# Patient Record
Sex: Female | Born: 1950 | Race: Black or African American | Hispanic: No | Smoking: Never smoker
Health system: Southern US, Community
[De-identification: ages and names within clinical notes are randomized; demographics above are authoritative.]

## PROBLEM LIST (undated history)

## (undated) DIAGNOSIS — J45909 Unspecified asthma, uncomplicated: Secondary | ICD-10-CM

## (undated) DIAGNOSIS — H269 Unspecified cataract: Secondary | ICD-10-CM

## (undated) DIAGNOSIS — T7840XA Allergy, unspecified, initial encounter: Secondary | ICD-10-CM

## (undated) DIAGNOSIS — I10 Essential (primary) hypertension: Secondary | ICD-10-CM

## (undated) DIAGNOSIS — K219 Gastro-esophageal reflux disease without esophagitis: Secondary | ICD-10-CM

## (undated) DIAGNOSIS — M1990 Unspecified osteoarthritis, unspecified site: Secondary | ICD-10-CM

## (undated) HISTORY — DX: Unspecified cataract: H26.9

## (undated) HISTORY — DX: Allergy, unspecified, initial encounter: T78.40XA

## (undated) HISTORY — PX: ABDOMINAL HYSTERECTOMY: SHX81

## (undated) HISTORY — DX: Gastro-esophageal reflux disease without esophagitis: K21.9

## (undated) HISTORY — DX: Unspecified osteoarthritis, unspecified site: M19.90

---
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
## 1998-05-20 ENCOUNTER — Ambulatory Visit (HOSPITAL_COMMUNITY): Admission: RE | Admit: 1998-05-20 | Discharge: 1998-05-20 | Payer: Self-pay | Admitting: Obstetrics and Gynecology

## 1998-05-20 ENCOUNTER — Encounter: Payer: Self-pay | Admitting: Obstetrics and Gynecology

## 2006-02-22 ENCOUNTER — Emergency Department (HOSPITAL_COMMUNITY): Admission: EM | Admit: 2006-02-22 | Discharge: 2006-02-22 | Payer: Self-pay | Admitting: Family Medicine

## 2008-11-22 ENCOUNTER — Emergency Department (HOSPITAL_COMMUNITY): Admission: EM | Admit: 2008-11-22 | Discharge: 2008-11-22 | Payer: Self-pay | Admitting: Family Medicine

## 2010-10-07 LAB — EYE CULTURE: Culture: NO GROWTH

## 2012-05-28 ENCOUNTER — Emergency Department (INDEPENDENT_AMBULATORY_CARE_PROVIDER_SITE_OTHER): Payer: Self-pay

## 2012-05-28 ENCOUNTER — Emergency Department (INDEPENDENT_AMBULATORY_CARE_PROVIDER_SITE_OTHER)
Admission: EM | Admit: 2012-05-28 | Discharge: 2012-05-28 | Disposition: A | Discharge status: Home / Self Care | Payer: Self-pay | Source: Home / Self Care | Attending: Family Medicine | Admitting: Family Medicine

## 2012-05-28 DIAGNOSIS — I10 Essential (primary) hypertension: Secondary | ICD-10-CM

## 2012-05-28 DIAGNOSIS — J9801 Acute bronchospasm: Secondary | ICD-10-CM

## 2012-05-28 DIAGNOSIS — R05 Cough: Secondary | ICD-10-CM

## 2012-05-28 DIAGNOSIS — R062 Wheezing: Secondary | ICD-10-CM

## 2012-05-28 HISTORY — DX: Essential (primary) hypertension: I10

## 2012-05-28 MED ORDER — PREDNISONE 20 MG PO TABS: 20.0000 mg | ORAL_TABLET | Freq: Two times a day (BID) | ORAL | Status: DC

## 2012-05-28 MED ORDER — ALBUTEROL SULFATE HFA 108 (90 BASE) MCG/ACT IN AERS: 2.0000 | INHALATION_SPRAY | Freq: Four times a day (QID) | RESPIRATORY_TRACT | Status: DC | PRN

## 2012-05-28 MED ORDER — TRIAMTERENE-HCTZ 37.5-25 MG PO TABS: 1.0000 | ORAL_TABLET | Freq: Every day | ORAL | Status: DC

## 2012-05-28 MED ORDER — BENZONATATE 200 MG PO CAPS: 200.0000 mg | ORAL_CAPSULE | Freq: Every day | ORAL | Status: DC

## 2012-05-28 NOTE — ED Provider Notes (Signed)
History     CSN: [id number]  Arrival date & time 05/28/12  1400   First MD Initiated Contact with Patient 05/28/12 1446      Chief Complaint  Patient presents with  . Wheezing      HPI Comments: Patient complains of chronic cough for several years, becoming worse over the past several weeks.  She took an unused Z-pack that her daughter had and noticed significant improvement, although she still has a mild cough and wheezing at night.  She also admits that she ran out of her hydrochlorothiazide about two months ago that she takes for hypertension.  She states that she has a history of seasonal rhinitis.  Her daughter has asthma.  She does not recall her last tetanus immunization.  No fevers, chills, and sweats   The history is provided by the patient and a relative.    Past Medical History  Diagnosis Date  . Hypertension     Past Surgical History  Procedure Date  . Abdominal hysterectomy     Family History  Problem Relation Age of Onset  . Hypertension Mother   . Heart failure Mother     History  Substance Use Topics  . Smoking status: Never Smoker   . Smokeless tobacco: Not on file  . Alcohol Use: Yes    OB History    Grav Para Term Preterm Abortions TAB SAB Ect Mult Living                  Review of Systems No sore throat + cough for several years No pleuritic pain + wheezing No nasal congestion + post-nasal drainage No sinus pain/pressure No itchy/red eyes No earache No hemoptysis ? SOB No fever/chills No nausea No vomiting No abdominal pain No diarrhea No urinary symptoms No skin rashes + fatigue No myalgias + headache Used OTC meds without relief  Allergies  Review of patient's allergies indicates not on file.  Home Medications   Current Outpatient Rx  Name  Route  Sig  Dispense  Refill  . ALBUTEROL SULFATE HFA 108 (90 BASE) MCG/ACT IN AERS   Inhalation   Inhale 2 puffs into the lungs every 6 (six) hours as needed for wheezing.   1  Inhaler   0   . BENZONATATE 200 MG PO CAPS   Oral   Take 1 capsule (200 mg total) by mouth at bedtime. Take as needed for cough   12 capsule   0   . PREDNISONE 20 MG PO TABS   Oral   Take 1 tablet (20 mg total) by mouth 2 (two) times daily.   10 tablet   0   . TRIAMTERENE-HCTZ 37.5-25 MG PO TABS   Oral   Take 1 each (1 tablet total) by mouth daily.   30 tablet   1     BP 158/78  Pulse 84  Temp 98.2 F (36.8 C) (Oral)  Resp 20  Ht 5\' 2"  (1.575 m)  Wt 166 lb (75.297 kg)  BMI 30.36 kg/m2  SpO2 95%  Physical Exam Nursing notes and Vital Signs reviewed. Appearance:  Patient appears healthy, stated age, and in no acute distress Eyes:  Pupils are equal, round, and reactive to light and accomodation.  Extraocular movement is intact.  Conjunctivae are not inflamed  Ears:  Canals normal.  Tympanic membranes normal.  Nose:  Normal turbinates.  No sinus tenderness.    Pharynx:  Normal Neck:  Supple. No adenopathy.  No JVD Lungs:  Clear to auscultation.  Breath sounds are equal.  Heart:  Regular rate and rhythm without murmurs, rubs, or gallops.  Abdomen:  Nontender without masses or hepatosplenomegaly.  Bowel sounds are present.  No CVA or flank tenderness.  Extremities:  No edema.  No calf tenderness Skin:  No rash present.   ED Course  Procedures none    Dg Chest 2 View  05/28/2012  *RADIOLOGY REPORT*  Clinical Data: Persistent cough  CHEST - 2 VIEW  Comparison: None.  Findings: Heart size is upper normal.  Negative for heart failure. Negative for infiltrate or effusion.  No mass lesion.  IMPRESSION: No active cardiopulmonary disease.   Original Report Authenticated By: Janeece Riggers, M.D.      1. Bronchospasm;  Suspect resolving pertussis   2. Essential hypertension, benign       MDM  Begin prednisone burst.  Begin albuterol MDI.  Prescription written for Benzonatate [facility]) to take at bedtime for night-time cough.  Resume Maxzide for Hypertension and followup  with PCP        Lattie Haw, MD 05/29/12 2146

## 2012-05-28 NOTE — ED Notes (Signed)
Pt states she has a chronic cough and the last couple of weeks wheezing/shortness of breath, unable to lay flat w/o wheezing.   W/o fever, self medicated with a Z- pak

## 2012-12-21 ENCOUNTER — Encounter (HOSPITAL_COMMUNITY): Payer: Self-pay | Admitting: Emergency Medicine

## 2012-12-21 ENCOUNTER — Emergency Department (INDEPENDENT_AMBULATORY_CARE_PROVIDER_SITE_OTHER)
Admission: EM | Admit: 2012-12-21 | Discharge: 2012-12-21 | Disposition: A | Discharge status: Home / Self Care | Payer: Self-pay | Source: Home / Self Care | Attending: Family Medicine | Admitting: Family Medicine

## 2012-12-21 DIAGNOSIS — J4521 Mild intermittent asthma with (acute) exacerbation: Secondary | ICD-10-CM

## 2012-12-21 DIAGNOSIS — J45901 Unspecified asthma with (acute) exacerbation: Secondary | ICD-10-CM

## 2012-12-21 MED ORDER — PREDNISONE 20 MG PO TABS: ORAL_TABLET | ORAL | Status: DC

## 2012-12-21 MED ORDER — CETIRIZINE HCL 10 MG PO TABS: 10.0000 mg | ORAL_TABLET | Freq: Every evening | ORAL | Status: DC | PRN

## 2012-12-21 MED ORDER — ALBUTEROL SULFATE HFA 108 (90 BASE) MCG/ACT IN AERS: 1.0000 | INHALATION_SPRAY | Freq: Four times a day (QID) | RESPIRATORY_TRACT | Status: DC | PRN

## 2012-12-21 MED ORDER — BENZONATATE 100 MG PO CAPS: 100.0000 mg | ORAL_CAPSULE | Freq: Three times a day (TID) | ORAL | Status: DC

## 2012-12-21 NOTE — ED Notes (Addendum)
Pt c/o persistent dry cough onset 2 weeks... sxs include wheezing... Denies cold sxs, fevers... Has taken mucinex for sxs w/no no relief.[facility]... She is alert and oriented w/no signs of acute distress.

## 2012-12-21 NOTE — ED Provider Notes (Signed)
History    CSN: [id number] Arrival date & time 12/21/12  1043  First MD Initiated Contact with Patient 12/21/12 1122     Chief Complaint  Patient presents with  . Cough   (Consider location/radiation/quality/duration/timing/severity/associated sxs/prior Treatment) HPI Comments: 62 year old female with history of hypertension and intermittent asthma symptoms. Here complaining of nonproductive cough and wheezing for the last week. Patient was treated with similar symptoms 6 months ago. Patient reports she has used albuterol about once a month since her last ED visit but has been using at least twice daily during the last few days. Current symptoms were preceded by nasal congestion and "sinus allergies" denies fever or chills. No chest pain. No headache or dizziness. Denies current or past history of acid reflux. Patient also reports that she has not taken her blood pressure medications in the last 2 days. He states she just forgot to take them but she has refills. Denies leg swelling or chest pain. No PND.  Past Medical History  Diagnosis Date  . Hypertension    Past Surgical History  Procedure Laterality Date  . Abdominal hysterectomy     Family History  Problem Relation Age of Onset  . Hypertension Mother   . Heart failure Mother    History  Substance Use Topics  . Smoking status: Never Smoker   . Smokeless tobacco: Not on file  . Alcohol Use: Yes   OB History   Grav Para Term Preterm Abortions TAB SAB Ect Mult Living                 Review of Systems  HENT: Positive for congestion, rhinorrhea and sneezing. Negative for sore throat.   Eyes: Negative for discharge.  Respiratory: Positive for cough and wheezing. Negative for shortness of breath.   Cardiovascular: Negative for chest pain, palpitations and leg swelling.  Gastrointestinal: Negative for nausea, vomiting, abdominal pain and diarrhea.  Skin: Negative for rash.  Neurological: Negative for dizziness and  headaches.  All other systems reviewed and are negative.    Allergies  Review of patient's allergies indicates no known allergies.  Home Medications   Current Outpatient Rx  Name  Route  Sig  Dispense  Refill  . albuterol (PROVENTIL HFA;VENTOLIN HFA) 108 (90 BASE) MCG/ACT inhaler   Inhalation   Inhale 1-2 puffs into the lungs every 6 (six) hours as needed for wheezing.   1 Inhaler   0   . benzonatate (TESSALON) 100 MG capsule   Oral   Take 1 capsule (100 mg total) by mouth every 8 (eight) hours.   21 capsule   0   . cetirizine (ZYRTEC) 10 MG tablet   Oral   Take 1 tablet (10 mg total) by mouth at bedtime as needed for allergies or rhinitis.   30 tablet   0   . predniSONE (DELTASONE) 20 MG tablet      2 tabs by mouth daily for 5 days.   10 tablet   0   . triamterene-hydrochlorothiazide (MAXZIDE-25) 37.5-25 MG per tablet   Oral   Take 1 each (1 tablet total) by mouth daily.   30 tablet   1    BP 151/89  Pulse 72  Temp(Src) 97.9 F (36.6 C) (Oral)  Resp 18  SpO2 98% Physical Exam  Vitals reviewed. Constitutional: She is oriented to person, place, and time. She appears well-developed and well-nourished.  HENT:  Head: Normocephalic and atraumatic.  Nasal Congestion with erythema and swelling of nasal turbinates, clear  rhinorrhea. no pharyngeal erythema no exudates. No uvula deviation. No trismus. TM's normal.  Eyes: Conjunctivae are normal. Right eye exhibits no discharge. Left eye exhibits no discharge.  Neck: Neck supple. No JVD present.  Cardiovascular: Normal rate, regular rhythm and normal heart sounds.   Pulmonary/Chest: Effort normal and breath sounds normal. No respiratory distress. She has no wheezes. She has no rales. She exhibits no tenderness.  Lymphadenopathy:    She has no cervical adenopathy.  Neurological: She is alert and oriented to person, place, and time.  Skin: No rash noted.    ED Course  Procedures (including critical care  time) Labs Reviewed - No data to display No results found. 1. Asthma exacerbation attacks, mild intermittent     MDM  Impress mild intermittent asthma exacerbation. Patient is not actively wheezing or in respiratory distress here. Prescribed albuterol, prednisone, cetirizine and Tessalon Perles. Recommended to restart blood pressure medications. Supportive care and red flags that should prompt return to medical attention discussed with patient and provided in writing. Specifically patient was asked to go to the emergency department if worsening respiratory symptoms despite following treatment and followup with her primary care provider in 1-3 weeks to recheck her blood pressure monitor her symptoms.  Sharin Grave, MD 12/22/12 [phone]

## 2013-06-07 ENCOUNTER — Encounter (HOSPITAL_BASED_OUTPATIENT_CLINIC_OR_DEPARTMENT_OTHER): Payer: Self-pay | Admitting: Emergency Medicine

## 2013-06-07 ENCOUNTER — Emergency Department (HOSPITAL_BASED_OUTPATIENT_CLINIC_OR_DEPARTMENT_OTHER): Payer: Medicaid Other

## 2013-06-07 ENCOUNTER — Emergency Department (HOSPITAL_BASED_OUTPATIENT_CLINIC_OR_DEPARTMENT_OTHER)
Admission: EM | Admit: 2013-06-07 | Discharge: 2013-06-07 | Disposition: A | Discharge status: Home / Self Care | Payer: Medicaid Other | Attending: Emergency Medicine | Admitting: Emergency Medicine

## 2013-06-07 DIAGNOSIS — J45901 Unspecified asthma with (acute) exacerbation: Secondary | ICD-10-CM | POA: Insufficient documentation

## 2013-06-07 DIAGNOSIS — R0982 Postnasal drip: Secondary | ICD-10-CM

## 2013-06-07 DIAGNOSIS — Z79899 Other long term (current) drug therapy: Secondary | ICD-10-CM | POA: Insufficient documentation

## 2013-06-07 DIAGNOSIS — I10 Essential (primary) hypertension: Secondary | ICD-10-CM | POA: Insufficient documentation

## 2013-06-07 MED ORDER — FLUTICASONE PROPIONATE 50 MCG/ACT NA SUSP: 2.0000 | Freq: Every day | NASAL | Status: DC

## 2013-06-07 MED ORDER — ALBUTEROL SULFATE (5 MG/ML) 0.5% IN NEBU
2.5000 mg | INHALATION_SOLUTION | Freq: Once | RESPIRATORY_TRACT | Status: AC
  Administered 2013-06-07: 2.5 mg via RESPIRATORY_TRACT
  Filled 2013-06-07: qty 0.5

## 2013-06-07 MED ORDER — PREDNISONE 50 MG PO TABS
60.0000 mg | ORAL_TABLET | Freq: Once | ORAL | Status: AC
  Administered 2013-06-07: 60 mg via ORAL
  Filled 2013-06-07 (×2): qty 1

## 2013-06-07 MED ORDER — FLUTICASONE PROPIONATE HFA 110 MCG/ACT IN AERO: 2.0000 | INHALATION_SPRAY | Freq: Two times a day (BID) | RESPIRATORY_TRACT | Status: DC

## 2013-06-07 MED ORDER — ALBUTEROL SULFATE HFA 108 (90 BASE) MCG/ACT IN AERS: 1.0000 | INHALATION_SPRAY | Freq: Four times a day (QID) | RESPIRATORY_TRACT | Status: DC | PRN

## 2013-06-07 MED ORDER — PREDNISONE 20 MG PO TABS: ORAL_TABLET | ORAL | Status: DC

## 2013-06-07 MED ORDER — GUAIFENESIN ER 600 MG PO TB12: 600.0000 mg | ORAL_TABLET | Freq: Two times a day (BID) | ORAL | Status: DC

## 2013-06-07 MED ORDER — IPRATROPIUM BROMIDE 0.02 % IN SOLN
0.5000 mg | Freq: Once | RESPIRATORY_TRACT | Status: AC
  Administered 2013-06-07: 0.5 mg via RESPIRATORY_TRACT
  Filled 2013-06-07: qty 2.5

## 2013-06-07 NOTE — ED Notes (Signed)
Cough and diff breathing for 4-5 days, denies fever. Pt states no relief after using mucinex or inhaler.

## 2013-06-07 NOTE — ED Notes (Signed)
MD at bedside. 

## 2013-06-07 NOTE — ED Notes (Signed)
Patient transported to X-ray 

## 2013-06-07 NOTE — ED Provider Notes (Signed)
CSN: [id number]     Arrival date & time 06/07/13  4098 History   First MD Initiated Contact with Patient 06/07/13 0356     Chief Complaint  Patient presents with  . Cough   (Consider location/radiation/quality/duration/timing/severity/associated sxs/prior Treatment) Patient is a 62 y.o. female presenting with cough. The history is provided by the patient.  Cough Severity:  Moderate Onset quality:  Gradual Timing:  Intermittent Progression:  Unchanged Chronicity:  Recurrent Smoker: no   Context: weather changes   Relieved by:  Nothing Worsened by:  Nothing tried Ineffective treatments:  Beta-agonist inhaler and decongestant Associated symptoms: wheezing   Associated symptoms: no chest pain, no diaphoresis, no fever and no sore throat   Wheezing:    Severity:  Moderate   Onset quality:  Gradual   Timing:  Intermittent   Progression:  Unchanged   Chronicity:  Recurrent Risk factors: no recent travel     Past Medical History  Diagnosis Date  . Hypertension    Past Surgical History  Procedure Laterality Date  . Abdominal hysterectomy     Family History  Problem Relation Age of Onset  . Hypertension Mother   . Heart failure Mother    History  Substance Use Topics  . Smoking status: Never Smoker   . Smokeless tobacco: Not on file  . Alcohol Use: Yes   OB History   Grav Para Term Preterm Abortions TAB SAB Ect Mult Living                 Review of Systems  Constitutional: Negative for fever and diaphoresis.  HENT: Negative for sore throat.   Respiratory: Positive for cough and wheezing.   Cardiovascular: Negative for chest pain, palpitations and leg swelling.  All other systems reviewed and are negative.    Allergies  Review of patient's allergies indicates no known allergies.  Home Medications   Current Outpatient Rx  Name  Route  Sig  Dispense  Refill  . albuterol (PROVENTIL HFA;VENTOLIN HFA) 108 (90 BASE) MCG/ACT inhaler   Inhalation   Inhale 1-2  puffs into the lungs every 6 (six) hours as needed for wheezing.   1 Inhaler   0   . triamterene-hydrochlorothiazide (MAXZIDE-25) 37.5-25 MG per tablet   Oral   Take 1 each (1 tablet total) by mouth daily.   30 tablet   1   . benzonatate (TESSALON) 100 MG capsule   Oral   Take 1 capsule (100 mg total) by mouth every 8 (eight) hours.   21 capsule   0   . cetirizine (ZYRTEC) 10 MG tablet   Oral   Take 1 tablet (10 mg total) by mouth at bedtime as needed for allergies or rhinitis.   30 tablet   0   . predniSONE (DELTASONE) 20 MG tablet      2 tabs by mouth daily for 5 days.   10 tablet   0    BP 181/96  Pulse 85  Temp(Src) 97.6 F (36.4 C) (Oral)  Resp 22  SpO2 98% Physical Exam  Constitutional: She is oriented to person, place, and time. She appears well-developed and well-nourished. No distress.  HENT:  Head: Normocephalic and atraumatic.  Mouth/Throat: Oropharynx is clear and moist.  Eyes: Conjunctivae are normal. Pupils are equal, round, and reactive to light.  Neck: Normal range of motion. Neck supple.  Pulmonary/Chest: No stridor. No respiratory distress. She has wheezes. She has no rales. She exhibits no tenderness.  Abdominal: Soft. Bowel sounds are  normal. There is no tenderness. There is no rebound.  Musculoskeletal: Normal range of motion.  Neurological: She is alert and oriented to person, place, and time.  Skin: Skin is warm and dry.  Psychiatric: She has a normal mood and affect.    ED Course  Procedures (including critical care time) Labs Review Labs Reviewed - No data to display Imaging Review No results found.  EKG Interpretation   None       MDM  No diagnosis found. Feels improved post neb treatment.  Lungs clear to auscultation post neb.  Will provide spacer for inhaler and give rx for prednisone.  Nasal steroids for post nasal drip    Burley Kopka K Skylar Priest-Rasch, MD 06/07/13 [phone]

## 2014-01-12 DIAGNOSIS — E7800 Pure hypercholesterolemia, unspecified: Secondary | ICD-10-CM | POA: Diagnosis not present

## 2014-01-12 DIAGNOSIS — M25569 Pain in unspecified knee: Secondary | ICD-10-CM | POA: Diagnosis not present

## 2014-01-12 DIAGNOSIS — I10 Essential (primary) hypertension: Secondary | ICD-10-CM | POA: Diagnosis not present

## 2014-01-12 DIAGNOSIS — Z0000 Encounter for general adult medical examination without abnormal findings: Secondary | ICD-10-CM | POA: Diagnosis not present

## 2014-01-12 DIAGNOSIS — R7309 Other abnormal glucose: Secondary | ICD-10-CM | POA: Diagnosis not present

## 2014-01-12 DIAGNOSIS — Z79899 Other long term (current) drug therapy: Secondary | ICD-10-CM | POA: Diagnosis not present

## 2014-01-12 DIAGNOSIS — E559 Vitamin D deficiency, unspecified: Secondary | ICD-10-CM | POA: Diagnosis not present

## 2014-01-16 DIAGNOSIS — I10 Essential (primary) hypertension: Secondary | ICD-10-CM | POA: Diagnosis not present

## 2014-01-16 DIAGNOSIS — E7800 Pure hypercholesterolemia, unspecified: Secondary | ICD-10-CM | POA: Diagnosis not present

## 2014-01-16 DIAGNOSIS — Z0000 Encounter for general adult medical examination without abnormal findings: Secondary | ICD-10-CM | POA: Diagnosis not present

## 2014-01-16 DIAGNOSIS — Z79899 Other long term (current) drug therapy: Secondary | ICD-10-CM | POA: Diagnosis not present

## 2014-01-16 DIAGNOSIS — R7309 Other abnormal glucose: Secondary | ICD-10-CM | POA: Diagnosis not present

## 2014-01-22 ENCOUNTER — Other Ambulatory Visit: Payer: Self-pay

## 2014-01-22 DIAGNOSIS — Z1231 Encounter for screening mammogram for malignant neoplasm of breast: Secondary | ICD-10-CM

## 2014-02-07 ENCOUNTER — Ambulatory Visit
Admission: RE | Admit: 2014-02-07 | Discharge: 2014-02-07 | Disposition: A | Discharge status: Home / Self Care | Payer: Medicare Other | Source: Ambulatory Visit

## 2014-02-07 DIAGNOSIS — Z1231 Encounter for screening mammogram for malignant neoplasm of breast: Secondary | ICD-10-CM

## 2014-03-22 ENCOUNTER — Emergency Department (HOSPITAL_BASED_OUTPATIENT_CLINIC_OR_DEPARTMENT_OTHER)
Admission: EM | Admit: 2014-03-22 | Discharge: 2014-03-22 | Disposition: A | Discharge status: Home / Self Care | Payer: Medicare Other | Attending: Emergency Medicine | Admitting: Emergency Medicine

## 2014-03-22 ENCOUNTER — Encounter (HOSPITAL_BASED_OUTPATIENT_CLINIC_OR_DEPARTMENT_OTHER): Payer: Self-pay | Admitting: Emergency Medicine

## 2014-03-22 ENCOUNTER — Emergency Department (HOSPITAL_BASED_OUTPATIENT_CLINIC_OR_DEPARTMENT_OTHER): Payer: Medicare Other

## 2014-03-22 DIAGNOSIS — Z79899 Other long term (current) drug therapy: Secondary | ICD-10-CM | POA: Diagnosis not present

## 2014-03-22 DIAGNOSIS — IMO0002 Reserved for concepts with insufficient information to code with codable children: Secondary | ICD-10-CM | POA: Diagnosis not present

## 2014-03-22 DIAGNOSIS — J45909 Unspecified asthma, uncomplicated: Secondary | ICD-10-CM | POA: Insufficient documentation

## 2014-03-22 DIAGNOSIS — R0789 Other chest pain: Secondary | ICD-10-CM | POA: Diagnosis not present

## 2014-03-22 DIAGNOSIS — I10 Essential (primary) hypertension: Secondary | ICD-10-CM | POA: Diagnosis not present

## 2014-03-22 DIAGNOSIS — R079 Chest pain, unspecified: Secondary | ICD-10-CM | POA: Diagnosis not present

## 2014-03-22 HISTORY — DX: Unspecified asthma, uncomplicated: J45.909

## 2014-03-22 LAB — CBC WITH DIFFERENTIAL/PLATELET
Basophils Absolute: 0 10*3/uL (ref 0.0–0.1)
Basophils Relative: 0 % (ref 0–1)
Eosinophils Absolute: 0.2 10*3/uL (ref 0.0–0.7)
Eosinophils Relative: 2 % (ref 0–5)
HEMATOCRIT: 42.7 % (ref 36.0–46.0)
HEMOGLOBIN: 14.1 g/dL (ref 12.0–15.0)
LYMPHS ABS: 2.3 10*3/uL (ref 0.7–4.0)
Lymphocytes Relative: 26 % (ref 12–46)
MCH: 30.7 pg (ref 26.0–34.0)
MCHC: 33 g/dL (ref 30.0–36.0)
MCV: 92.8 fL (ref 78.0–100.0)
MONO ABS: 0.8 10*3/uL (ref 0.1–1.0)
MONOS PCT: 9 % (ref 3–12)
NEUTROS ABS: 5.5 10*3/uL (ref 1.7–7.7)
NEUTROS PCT: 63 % (ref 43–77)
Platelets: 284 10*3/uL (ref 150–400)
RBC: 4.6 MIL/uL (ref 3.87–5.11)
RDW: 12.7 % (ref 11.5–15.5)
WBC: 8.7 10*3/uL (ref 4.0–10.5)

## 2014-03-22 LAB — BASIC METABOLIC PANEL
Anion gap: 13 (ref 5–15)
BUN: 21 mg/dL (ref 6–23)
CHLORIDE: 105 meq/L (ref 96–112)
CO2: 25 meq/L (ref 19–32)
Calcium: 10.4 mg/dL (ref 8.4–10.5)
Creatinine, Ser: 1 mg/dL (ref 0.50–1.10)
GFR calc Af Amer: 67 mL/min — ABNORMAL LOW (ref >90)
GFR calc non Af Amer: 58 mL/min — ABNORMAL LOW (ref >90)
GLUCOSE: 126 mg/dL — AB (ref 70–99)
POTASSIUM: 3.9 meq/L (ref 3.7–5.3)
Sodium: 143 mEq/L (ref 137–147)

## 2014-03-22 LAB — TROPONIN I: Troponin I: <0.3 ng/mL (ref <0.30)

## 2014-03-22 MED ORDER — GI COCKTAIL ~~LOC~~
30.0000 mL | Freq: Once | ORAL | Status: AC
  Administered 2014-03-22: 30 mL via ORAL
  Filled 2014-03-22: qty 30

## 2014-03-22 MED ORDER — PANTOPRAZOLE SODIUM 40 MG PO TBEC
40.0000 mg | DELAYED_RELEASE_TABLET | Freq: Once | ORAL | Status: AC
  Administered 2014-03-22: 40 mg via ORAL
  Filled 2014-03-22: qty 1

## 2014-03-22 NOTE — ED Notes (Signed)
Pt c/o chest tightness x 3 days,  Denies sob  States has been wheezing

## 2014-03-22 NOTE — ED Notes (Signed)
Pt c/o " chest tightness"n x 3 hrs denies SOB or nausea

## 2014-03-22 NOTE — ED Notes (Signed)
Ask by RN to assess pt for wheezes. No wheezes heard equal breath sounds pt in no distress.

## 2014-03-22 NOTE — Discharge Instructions (Signed)

## 2014-03-22 NOTE — ED Provider Notes (Signed)
CSN: [id number]     Arrival date & time 03/22/14  0026; seen by myself at 0039 History   None    Chief Complaint  Patient presents with  . Chest Pain     (Consider location/radiation/quality/duration/timing/severity/associated sxs/prior Treatment) HPI This is a 63 year old female with a history of hypertension and asthma. She developed substernal tightness after eating dinner at 9:30 PM yesterday evening. She particularly attributes this to eating a cinnamon bun rapidly. The symptoms were moderate but never severe. She is equivocal about whether there was associated shortness of breath but there was associated coughing. She describes a tightness is also being accompanied by heartburn. Symptoms had significantly improved by the time she arrived here. Her residual heartburn was completely relieved by a GI cocktail. She denies nausea, vomiting, diarrhea or diaphoresis. She states the chest tightness was somewhat like her asthma but she was not wheezing on this occasion. There was no exacerbation with exertion and no relief with rest.   Past Medical History  Diagnosis Date  . Hypertension   . Asthma    Past Surgical History  Procedure Laterality Date  . Abdominal hysterectomy     Family History  Problem Relation Age of Onset  . Hypertension Mother   . Heart failure Mother    History  Substance Use Topics  . Smoking status: Never Smoker   . Smokeless tobacco: Not on file  . Alcohol Use: Yes   OB History   Grav Para Term Preterm Abortions TAB SAB Ect Mult Living                 Review of Systems  All other systems reviewed and are negative.   Allergies  Review of patient's allergies indicates no known allergies.  Home Medications   Prior to Admission medications   Medication Sig Start Date End Date Taking? Authorizing Provider  albuterol (PROVENTIL HFA;VENTOLIN HFA) 108 (90 BASE) MCG/ACT inhaler Inhale 1-2 puffs into the lungs every 6 (six) hours as needed for wheezing.  12/21/12   Adlih Moreno-Coll, MD  albuterol (PROVENTIL HFA;VENTOLIN HFA) 108 (90 BASE) MCG/ACT inhaler Inhale 1-2 puffs into the lungs every 6 (six) hours as needed for wheezing or shortness of breath. 06/07/13   April K Palumbo-Rasch, MD  benzonatate (TESSALON) 100 MG capsule Take 1 capsule (100 mg total) by mouth every 8 (eight) hours. 12/21/12   Adlih Moreno-Coll, MD  cetirizine (ZYRTEC) 10 MG tablet Take 1 tablet (10 mg total) by mouth at bedtime as needed for allergies or rhinitis. 12/21/12   Adlih Moreno-Coll, MD  fluticasone (FLONASE) 50 MCG/ACT nasal spray Place 2 sprays into both nostrils daily. 06/07/13   April K Palumbo-Rasch, MD  fluticasone (FLOVENT HFA) 110 MCG/ACT inhaler Inhale 2 puffs into the lungs 2 (two) times daily. 06/07/13   April K Palumbo-Rasch, MD  guaiFENesin (MUCINEX) 600 MG 12 hr tablet Take 1 tablet (600 mg total) by mouth 2 (two) times daily. 06/07/13   April K Palumbo-Rasch, MD  triamterene-hydrochlorothiazide (MAXZIDE-25) 37.5-25 MG per tablet Take 1 each (1 tablet total) by mouth daily. 05/28/12   Lattie Haw, MD   BP 159/81  Pulse 96  Temp(Src) 97.8 F (36.6 C)  Resp 18  Ht  (1.575 m)  Wt 170 lb (77.111 kg)  BMI 31.09 kg/m2  SpO2 99%  Physical Exam General: Well-developed, well-nourished female in no acute distress; appearance consistent with age of record HENT: normocephalic; atraumatic Eyes: pupils equal, round and reactive to light; extraocular muscles intact Neck:  supple Heart: regular rate and rhythm; no murmurs, rubs or gallops Lungs: clear to auscultation bilaterally Abdomen: soft; nondistended; nontender; no masses or hepatosplenomegaly; bowel sounds present Extremities: No deformity; full range of motion; pulses normal Neurologic: Awake, alert and oriented; motor function intact in all extremities and symmetric; no facial droop Skin: Warm and dry Psychiatric: Normal mood and affect    ED Course  Procedures (including critical care  time)  MDM    EKG Interpretation  Date/Time:  Thursday March 22 2014 00:39:50 EDT Ventricular Rate:  87 PR Interval:  184 QRS Duration: 106 QT Interval:  380 QTC Calculation: 457 R Axis:   -44 Text Interpretation:  Normal sinus rhythm Left axis deviation Pulmonary disease pattern Incomplete right bundle branch block Nonspecific T wave abnormality Abnormal ECG No old tracing to compare Confirmed by Linette Gunderson  MD, Jonny Ruiz ([id number]) on 03/22/2014 3:35:17 AM      Nursing notes and vitals signs, including pulse oximetry, reviewed.  Summary of this visit's results, reviewed by myself:  Labs:  Results for orders placed during the hospital encounter of 03/22/14 (from the past 24 hour(s))  CBC WITH DIFFERENTIAL     Status: None   Collection Time    03/22/14  2:45 AM      Result Value Ref Range   WBC 8.7  4.0 - 10.5 K/uL   RBC 4.60  3.87 - 5.11 MIL/uL   Hemoglobin 14.1  12.0 - 15.0 g/dL   HCT 21.3  08.6 - 57.8 %   MCV 92.8  78.0 - 100.0 fL   MCH 30.7  26.0 - 34.0 pg   MCHC 33.0  30.0 - 36.0 g/dL   RDW 46.9  62.9 - 52.8 %   Platelets 284  150 - 400 K/uL   Neutrophils Relative % 63  43 - 77 %   Neutro Abs 5.5  1.7 - 7.7 K/uL   Lymphocytes Relative 26  12 - 46 %   Lymphs Abs 2.3  0.7 - 4.0 K/uL   Monocytes Relative 9  3 - 12 %   Monocytes Absolute 0.8  0.1 - 1.0 K/uL   Eosinophils Relative 2  0 - 5 %   Eosinophils Absolute 0.2  0.0 - 0.7 K/uL   Basophils Relative 0  0 - 1 %   Basophils Absolute 0.0  0.0 - 0.1 K/uL  BASIC METABOLIC PANEL     Status: Abnormal   Collection Time    03/22/14  2:45 AM      Result Value Ref Range   Sodium 143  137 - 147 mEq/L   Potassium 3.9  3.7 - 5.3 mEq/L   Chloride 105  96 - 112 mEq/L   CO2 25  19 - 32 mEq/L   Glucose, Bld 126 (*) 70 - 99 mg/dL   BUN 21  6 - 23 mg/dL   Creatinine, Ser 4.13  0.50 - 1.10 mg/dL   Calcium 24.4  8.4 - 01.0 mg/dL   GFR calc non Af Amer 58 (*) >90 mL/min   GFR calc Af Amer 67 (*) >90 mL/min   Anion gap 13  5 - 15   TROPONIN I     Status: None   Collection Time    03/22/14  2:45 AM      Result Value Ref Range   Troponin I <0.30  <0.30 ng/mL  TROPONIN I     Status: None   Collection Time    03/22/14  3:51 AM  Result Value Ref Range   Troponin I <0.30  <0.30 ng/mL    Imaging Studies: Dg Chest 2 View  03/22/2014   CLINICAL DATA:  Chest tightness, sudden onset last night  EXAM: CHEST  2 VIEW  COMPARISON:  05/29/2013  FINDINGS: Stable mild cardiac enlargement. Vascular pattern is normal. No consolidation effusion or pneumothorax.  IMPRESSION: No active cardiopulmonary disease.   Electronically Signed   By: Esperanza Heir M.D.   On: 03/22/2014 02:56   4:23 AM Patient continues to be asymptomatic in the ED after GI cocktail. Patient advised to contact PCP today for follow up or return if worsening.      Hanley Seamen, MD 03/22/14 [phone]

## 2014-04-16 DIAGNOSIS — Z13820 Encounter for screening for osteoporosis: Secondary | ICD-10-CM | POA: Diagnosis not present

## 2014-04-16 DIAGNOSIS — M810 Age-related osteoporosis without current pathological fracture: Secondary | ICD-10-CM | POA: Diagnosis not present

## 2014-06-06 DIAGNOSIS — R062 Wheezing: Secondary | ICD-10-CM | POA: Diagnosis not present

## 2014-06-06 DIAGNOSIS — Z79899 Other long term (current) drug therapy: Secondary | ICD-10-CM | POA: Diagnosis not present

## 2014-06-06 DIAGNOSIS — I10 Essential (primary) hypertension: Secondary | ICD-10-CM | POA: Diagnosis not present

## 2014-06-06 DIAGNOSIS — F418 Other specified anxiety disorders: Secondary | ICD-10-CM | POA: Diagnosis not present

## 2014-08-10 DIAGNOSIS — H40003 Preglaucoma, unspecified, bilateral: Secondary | ICD-10-CM | POA: Diagnosis not present

## 2014-08-10 DIAGNOSIS — H3589 Other specified retinal disorders: Secondary | ICD-10-CM | POA: Diagnosis not present

## 2014-08-10 DIAGNOSIS — H52223 Regular astigmatism, bilateral: Secondary | ICD-10-CM | POA: Diagnosis not present

## 2014-08-10 DIAGNOSIS — H40013 Open angle with borderline findings, low risk, bilateral: Secondary | ICD-10-CM | POA: Diagnosis not present

## 2014-08-10 DIAGNOSIS — H04123 Dry eye syndrome of bilateral lacrimal glands: Secondary | ICD-10-CM | POA: Diagnosis not present

## 2014-08-10 DIAGNOSIS — H524 Presbyopia: Secondary | ICD-10-CM | POA: Diagnosis not present

## 2014-08-23 DIAGNOSIS — H11153 Pinguecula, bilateral: Secondary | ICD-10-CM | POA: Diagnosis not present

## 2014-08-23 DIAGNOSIS — H40013 Open angle with borderline findings, low risk, bilateral: Secondary | ICD-10-CM | POA: Diagnosis not present

## 2014-08-23 DIAGNOSIS — H3589 Other specified retinal disorders: Secondary | ICD-10-CM | POA: Diagnosis not present

## 2014-08-23 DIAGNOSIS — H04123 Dry eye syndrome of bilateral lacrimal glands: Secondary | ICD-10-CM | POA: Diagnosis not present

## 2014-08-23 DIAGNOSIS — H2513 Age-related nuclear cataract, bilateral: Secondary | ICD-10-CM | POA: Diagnosis not present

## 2014-08-23 DIAGNOSIS — H18413 Arcus senilis, bilateral: Secondary | ICD-10-CM | POA: Diagnosis not present

## 2014-10-02 ENCOUNTER — Ambulatory Visit (INDEPENDENT_AMBULATORY_CARE_PROVIDER_SITE_OTHER): Payer: Medicare Other | Admitting: Internal Medicine

## 2014-10-02 ENCOUNTER — Encounter: Payer: Self-pay | Admitting: Internal Medicine

## 2014-10-02 ENCOUNTER — Other Ambulatory Visit (INDEPENDENT_AMBULATORY_CARE_PROVIDER_SITE_OTHER): Payer: Medicare Other

## 2014-10-02 VITALS — BP 152/92 | HR 84 | Temp 98.1°F | Resp 14 | Ht 62.0 in | Wt 169.1 lb

## 2014-10-02 DIAGNOSIS — R7301 Impaired fasting glucose: Secondary | ICD-10-CM

## 2014-10-02 DIAGNOSIS — Z0000 Encounter for general adult medical examination without abnormal findings: Secondary | ICD-10-CM | POA: Diagnosis not present

## 2014-10-02 DIAGNOSIS — I10 Essential (primary) hypertension: Secondary | ICD-10-CM

## 2014-10-02 DIAGNOSIS — R0602 Shortness of breath: Secondary | ICD-10-CM | POA: Diagnosis not present

## 2014-10-02 DIAGNOSIS — E789 Disorder of lipoprotein metabolism, unspecified: Secondary | ICD-10-CM

## 2014-10-02 DIAGNOSIS — Z0001 Encounter for general adult medical examination with abnormal findings: Secondary | ICD-10-CM

## 2014-10-02 DIAGNOSIS — F411 Generalized anxiety disorder: Secondary | ICD-10-CM

## 2014-10-02 DIAGNOSIS — E669 Obesity, unspecified: Secondary | ICD-10-CM

## 2014-10-02 LAB — COMPREHENSIVE METABOLIC PANEL
ALK PHOS: 94 U/L (ref 39–117)
ALT: 14 U/L (ref 0–35)
AST: 15 U/L (ref 0–37)
Albumin: 4.3 g/dL (ref 3.5–5.2)
BILIRUBIN TOTAL: 0.4 mg/dL (ref 0.2–1.2)
BUN: 12 mg/dL (ref 6–23)
CO2: 29 mEq/L (ref 19–32)
Calcium: 11.1 mg/dL — ABNORMAL HIGH (ref 8.4–10.5)
Chloride: 104 mEq/L (ref 96–112)
Creatinine, Ser: 0.81 mg/dL (ref 0.40–1.20)
GFR: 91.03 mL/min (ref >60.00)
Glucose, Bld: 94 mg/dL (ref 70–99)
Potassium: 3.9 mEq/L (ref 3.5–5.1)
Sodium: 138 mEq/L (ref 135–145)
TOTAL PROTEIN: 7.4 g/dL (ref 6.0–8.3)

## 2014-10-02 LAB — CBC
HEMATOCRIT: 45.5 % (ref 36.0–46.0)
HEMOGLOBIN: 15.5 g/dL — AB (ref 12.0–15.0)
MCHC: 34.1 g/dL (ref 30.0–36.0)
MCV: 88.9 fl (ref 78.0–100.0)
PLATELETS: 304 10*3/uL (ref 150.0–400.0)
RBC: 5.12 Mil/uL — AB (ref 3.87–5.11)
RDW: 13.4 % (ref 11.5–15.5)
WBC: 7.4 10*3/uL (ref 4.0–10.5)

## 2014-10-02 LAB — LIPID PANEL
CHOLESTEROL: 205 mg/dL — AB (ref 0–200)
HDL: 64.4 mg/dL (ref >39.00)
LDL Cholesterol: 117 mg/dL — ABNORMAL HIGH (ref 0–99)
NonHDL: 140.6
TRIGLYCERIDES: 118 mg/dL (ref 0.0–149.0)
Total CHOL/HDL Ratio: 3
VLDL: 23.6 mg/dL (ref 0.0–40.0)

## 2014-10-02 LAB — HEMOGLOBIN A1C: HEMOGLOBIN A1C: 5.9 % (ref 4.6–6.5)

## 2014-10-02 NOTE — Progress Notes (Signed)
Pre visit review using our clinic review tool, if applicable. No additional management support is needed unless otherwise documented below in the visit note. 

## 2014-10-02 NOTE — Patient Instructions (Signed)
We will check the blood work today and call you back about the results.   We would recommend talking to a counselor about your anxiety to see if they can come up with solutions for you that are not medicine. You can keep the rest of the clonazepam but I do not recommend taking it except when absolutely necessary then using 1/2 tablet.   We will send you for a breathing test to see if you really have asthma or lung problems. If you do not then you will not need breathing medicines on a regular basis.   We will see you back in about 6 months to see how you are doing. Exercise is a good way to help manage stress and you should be exercising 3 times a week for 30 minutes a day.   Stress and Stress Management Stress is a normal reaction to life events. It is what you feel when life demands more than you are used to or more than you can handle. Some stress can be useful. For example, the stress reaction can help you catch the last bus of the day, study for a test, or meet a deadline at work. But stress that occurs too often or for too long can cause problems. It can affect your emotional health and interfere with relationships and normal daily activities. Too much stress can weaken your immune system and increase your risk for physical illness. If you already have a medical problem, stress can make it worse. CAUSES  All sorts of life events may cause stress. An event that causes stress for one person may not be stressful for another person. Major life events commonly cause stress. These may be positive or negative. Examples include losing your job, moving into a new home, getting married, having a baby, or losing a loved one. Less obvious life events may also cause stress, especially if they occur day after day or in combination. Examples include working long hours, driving in traffic, caring for children, being in debt, or being in a difficult relationship. SIGNS AND SYMPTOMS Stress may cause emotional  symptoms including, the following:  Anxiety. This is feeling worried, afraid, on edge, overwhelmed, or out of control.  Anger. This is feeling irritated or impatient.  Depression. This is feeling sad, down, helpless, or guilty.  Difficulty focusing, remembering, or making decisions. Stress may cause physical symptoms, including the following:   Aches and pains. These may affect your head, neck, back, stomach, or other areas of your body.  Tight muscles or clenched jaw.  Low energy or trouble sleeping. Stress may cause unhealthy behaviors, including the following:   Eating to feel better (overeating) or skipping meals.  Sleeping too little, too much, or both.  Working too much or putting off tasks (procrastination).  Smoking, drinking alcohol, or using drugs to feel better. DIAGNOSIS  Stress is diagnosed through an assessment by your health care provider. Your health care provider will ask questions about your symptoms and any stressful life events.Your health care provider will also ask about your medical history and may order blood tests or other tests. Certain medical conditions and medicine can cause physical symptoms similar to stress. Mental illness can cause emotional symptoms and unhealthy behaviors similar to stress. Your health care provider may refer you to a mental health professional for further evaluation.  TREATMENT  Stress management is the recommended treatment for stress.The goals of stress management are reducing stressful life events and coping with stress in healthy ways.  Techniques for reducing stressful life events include the following:  Stress identification. Self-monitor for stress and identify what causes stress for you. These skills may help you to avoid some stressful events.  Time management. Set your priorities, keep a calendar of events, and learn to say "no." These tools can help you avoid making too many commitments. Techniques for coping with  stress include the following:  Rethinking the problem. Try to think realistically about stressful events rather than ignoring them or overreacting. Try to find the positives in a stressful situation rather than focusing on the negatives.  Exercise. Physical exercise can release both physical and emotional tension. The key is to find a form of exercise you enjoy and do it regularly.  Relaxation techniques. These relax the body and mind. Examples include yoga, meditation, tai chi, biofeedback, deep breathing, progressive muscle relaxation, listening to music, being out in nature, journaling, and other hobbies. Again, the key is to find one or more that you enjoy and can do regularly.  Healthy lifestyle. Eat a balanced diet, get plenty of sleep, and do not smoke. Avoid using alcohol or drugs to relax.  Strong support network. Spend time with family, friends, or other people you enjoy being around.Express your feelings and talk things over with someone you trust. Counseling or talktherapy with a mental health professional may be helpful if you are having difficulty managing stress on your own. Medicine is typically not recommended for the treatment of stress.Talk to your health care provider if you think you need medicine for symptoms of stress. HOME CARE INSTRUCTIONS  Keep all follow-up visits as directed by your health care provider.  Take all medicines as directed by your health care provider. SEEK MEDICAL CARE IF:  Your symptoms get worse or you start having new symptoms.  You feel overwhelmed by your problems and can no longer manage them on your own. SEEK IMMEDIATE MEDICAL CARE IF:  You feel like hurting yourself or someone else. Document Released: 12/09/2000 Document Revised: 10/30/2013 Document Reviewed: 02/07/2013 [facility] Patient Information 2015 [place], [place]. This information is not intended to replace advice given to you by your health care provider. Make sure you discuss any  questions you have with your health care provider.

## 2014-10-04 ENCOUNTER — Encounter: Payer: Self-pay | Admitting: Internal Medicine

## 2014-10-04 DIAGNOSIS — F411 Generalized anxiety disorder: Secondary | ICD-10-CM | POA: Insufficient documentation

## 2014-10-04 DIAGNOSIS — I10 Essential (primary) hypertension: Secondary | ICD-10-CM | POA: Insufficient documentation

## 2014-10-04 DIAGNOSIS — R058 Other specified cough: Secondary | ICD-10-CM | POA: Insufficient documentation

## 2014-10-04 DIAGNOSIS — R05 Cough: Secondary | ICD-10-CM | POA: Insufficient documentation

## 2014-10-04 NOTE — Progress Notes (Signed)
**Note Theresa-Identified via Obfuscation**    Subjective:    Patient ID: Theresa Terry, female    DOB: 08-12-1950, 64 y.o.   MRN: [id number]  HPI The patient is a 64 YO female who is coming in about her breathing. She has been having episodes of SOB where she ends up in the ER and has been diagnosed with asthma in the last year. She feels that mostly it is from her anxiety. She does not notice that the inhalers she has been given have helped her breathing. She was also given some prednisone which she does not feel helped. She has never had asthma before. She is a never smoker but did work in [employer] for many years. Denies SOB with exertion or exercise.   [facility], social history reviewed and updated.   Review of Systems  Constitutional: Negative for fever, activity change, appetite change, fatigue and unexpected weight change.  HENT: Negative.   Eyes: Negative.   Respiratory: Positive for shortness of breath and wheezing. Negative for cough and chest tightness.   Cardiovascular: Negative for chest pain, palpitations and leg swelling.  Gastrointestinal: Negative for nausea, abdominal pain, diarrhea, constipation and abdominal distention.  Musculoskeletal: Negative.   Skin: Negative.   Neurological: Negative.  Negative for weakness and headaches.  Psychiatric/Behavioral: Positive for sleep disturbance and decreased concentration. Negative for suicidal ideas and self-injury. The patient is nervous/anxious.       Objective:   Physical Exam  Constitutional: She is oriented to person, place, and time. She appears well-developed and well-nourished.  Overweight  HENT:  Head: Normocephalic and atraumatic.  Eyes: EOM are normal.  Neck: Normal range of motion.  Cardiovascular: Normal rate and regular rhythm.   Pulmonary/Chest: Effort normal and breath sounds normal. No respiratory distress. She has no wheezes. She has no rales. She exhibits no tenderness.  Abdominal: Soft. Bowel sounds are normal.  Musculoskeletal: She exhibits no  edema.  Neurological: She is alert and oriented to person, place, and time.  Skin: Skin is warm and dry.  Psychiatric:  Slightly anxious during our visit   Filed Vitals:   10/02/14 1007  BP: 152/92  Pulse: 84  Temp: 98.1 F (36.7 C)  TempSrc: Oral  Resp: 14  Height: 5\' 2"  (1.575 m)  Weight: 169 lb 1.9 oz (76.712 kg)  SpO2: 94%      Assessment & Plan:

## 2014-10-04 NOTE — Assessment & Plan Note (Signed)
Checking HgA1c, lipid panel, CMP today to ensure no missed complications from her obesity.

## 2014-10-04 NOTE — Assessment & Plan Note (Signed)
Currently taking maxzide and BP elevated today. Per her reports she is well controlled at home. Will recheck next visit and adjust therapy as needed. Check labs today.

## 2014-10-04 NOTE — Assessment & Plan Note (Addendum)
She had been given clonazepam to take TID prn but she wisely did not do such. She took 1 and got very dazed. Advised if emergency can take 1/2 pill. Would recommend counseling with CBT since she would like to avoid medication. If needs medication would pursue SSRI rather than benzo. Referral for counseling today.

## 2014-10-04 NOTE — Assessment & Plan Note (Signed)
Previously diagnosed with asthma but unclear to me if this is the diagnosis. She will continue with the flovent daily and albuterol prn but will order PFT with metcholine challenge (if needed) to see if asthma is true diagnosis. Another concern would be vocal cord dysfunction as she describes some wheezing that happens in these acute episodes. Not in acute flare. Also feel that her anxiety could be playing a role and will work to better control that.

## 2014-10-26 ENCOUNTER — Ambulatory Visit (INDEPENDENT_AMBULATORY_CARE_PROVIDER_SITE_OTHER): Payer: Medicare Other | Admitting: Family Medicine

## 2014-10-26 ENCOUNTER — Ambulatory Visit (INDEPENDENT_AMBULATORY_CARE_PROVIDER_SITE_OTHER): Payer: Medicare Other

## 2014-10-26 ENCOUNTER — Telehealth: Payer: Self-pay | Admitting: Internal Medicine

## 2014-10-26 VITALS — BP 120/74 | HR 83 | Temp 98.0°F | Resp 16 | Ht 62.0 in | Wt 164.0 lb

## 2014-10-26 DIAGNOSIS — M1712 Unilateral primary osteoarthritis, left knee: Secondary | ICD-10-CM

## 2014-10-26 DIAGNOSIS — M2392 Unspecified internal derangement of left knee: Secondary | ICD-10-CM

## 2014-10-26 DIAGNOSIS — M25562 Pain in left knee: Secondary | ICD-10-CM

## 2014-10-26 DIAGNOSIS — M238X2 Other internal derangements of left knee: Secondary | ICD-10-CM

## 2014-10-26 MED ORDER — MELOXICAM 7.5 MG PO TABS: 7.5000 mg | ORAL_TABLET | Freq: Every day | ORAL | Status: DC

## 2014-10-26 NOTE — Telephone Encounter (Signed)
Patient Name: Theresa Terry DOB: 1950/11/03 Initial Comment Caller states she has knee pain and swelling for the last week Nurse Assessment Nurse: Charna Elizabethrumbull, RN, Cathy Date/Time (Eastern Time): 10/26/2014 9:12:33 AM Confirm and document reason for call. If symptomatic, describe symptoms. ---Caller states she developed pain and swelling of her left knee about a week ago. No known injury. No fever. Has the patient traveled out of the country within the last 30 days? ---No Does the patient require triage? ---Yes Related visit to physician within the last 2 weeks? ---No Does the PT have any chronic conditions? (i.e. diabetes, asthma, etc.) ---Yes List chronic conditions. ---High Blood Pressure, Asthma? Guidelines Guideline Title Affirmed Question Affirmed Notes Knee Pain [1] Thigh or calf pain AND [2] only 1 side AND [3] present > 1 hour Final Disposition User See Physician within 4 Hours (or PCP triage) Charna Elizabethrumbull, RN, Cathy Comments Caller plans to go to urgent care facility.

## 2014-10-26 NOTE — Progress Notes (Signed)
**Note Theresa-Identified via Obfuscation** [facility] [address], [place] [address] [phone]  Date:  10/26/2014   Name:  Theresa Terry   DOB:  Aug 06, 1950   MRN:  [id number]  PCP:  Judie BonusKollar, Elizabeth A, MD    Chief Complaint: Knee Pain   History of Present Illness:  Theresa Terry is a 64 y.o. very pleasant female patient who presents with the following:  Here today as a new patient.  She is here today with left knee pain and swelling for a week. She does spend a lot of time on her feet. She is not aware of any injury. Insidious onset of pain.  She just happened to notice that the left knee if "puffy and tight to stretch it out and bend it."   She thinks that she probably has some OA. She sometimes has some shoulder pain, hand pain and stiffness.   She tried some aspirin for her knee pain.  OW she was not sure what to try  Patient Active Problem List   Diagnosis Date Noted  . SOB (shortness of breath) 10/04/2014  . Anxiety state 10/04/2014  . Essential hypertension 10/04/2014  . Obesity 10/04/2014    Past Medical History  Diagnosis Date  . Hypertension   . Asthma   . Arthritis   . GERD (gastroesophageal reflux disease)   . Allergy   . Cataract     Past Surgical History  Procedure Laterality Date  . Abdominal hysterectomy    . Tonsillectomy and adenoidectomy  1961    History  Substance Use Topics  . Smoking status: Never Smoker   . Smokeless tobacco: Not on file  . Alcohol Use: Yes    Family History  Problem Relation Age of Onset  . Hypertension Mother   . Heart failure Mother   . Alcohol abuse Mother   . Heart disease Mother   . Alcohol abuse Father   . Heart disease Father   . Alcohol abuse Brother   . Heart disease Maternal Aunt   . Heart disease Maternal Uncle     No Known Allergies  Medication list has been reviewed and updated.  Current Outpatient Prescriptions on File Prior to Visit  Medication Sig Dispense Refill  . albuterol (PROVENTIL HFA;VENTOLIN  HFA) 108 (90 BASE) MCG/ACT inhaler Inhale 1-2 puffs into the lungs every 6 (six) hours as needed for wheezing. 1 Inhaler 0  . clonazePAM (KLONOPIN) 1 MG tablet Take 1 mg by mouth 3 (three) times daily as needed for anxiety. Patient only takes 1/2 tablet when needed.    . fluticasone (FLOVENT HFA) 110 MCG/ACT inhaler Inhale 2 puffs into the lungs 2 (two) times daily. 1 Inhaler 0  . triamterene-hydrochlorothiazide (MAXZIDE-25) 37.5-25 MG per tablet Take 1 each (1 tablet total) by mouth daily. 30 tablet 1   No current facility-administered medications on file prior to visit.    Review of Systems:  As per HPI- otherwise negative.   Physical Examination: Filed Vitals:   10/26/14 1219  BP: 120/74  Pulse: 83  Temp: 98 F (36.7 C)  Resp: 16   Filed Vitals:   10/26/14 1219  Height: 5\' 2"  (1.575 m)  Weight: 164 lb (74.39 kg)   Body mass index is 29.99 kg/(m^2). Ideal Body Weight: Weight in (lb) to have BMI = 25: 136.4  GEN: WDWN, NAD, Non-toxic, A & O x 3, looks well HEENT: Atraumatic, Normocephalic. Neck supple. No masses, No LAD. Ears and Nose: No external deformity. CV: RRR, No  M/G/R. No JVD. No thrill. No extra heart sounds. PULM: CTA B, no wheezes, crackles, rhonchi. No retractions. No resp. distress. No accessory muscle use. EXTR: No c/c/e NEURO Normal gait.  PSYCH: Normally interactive. Conversant. Not depressed or anxious appearing.  Calm demeanor.  Left knee: minimal to no effusion, but significant crepitus.  No heat.  No redness, no tenderness to exam, ligaments stable  [facility] reading (PRIMARY) by  Dr. Patsy Lager. Left knee: patellofemoral compartment OA. Possible loose body  LEFT KNEE - COMPLETE 4+ VIEW  COMPARISON: None.  FINDINGS: Normal alignment no fracture. Possible small effusion. Mild degenerative change and spurring of the patella. Medial lateral joint spaces are intact. Fabella noted.  IMPRESSION: Minimal degenerative change at the patella with a small  joint effusion. Assessment and Plan: Left knee pain - Plan: DG Knee Complete 4 Views Left, meloxicam (MOBIC) 7.5 MG tablet  Crepitus of joint of left knee - Plan: DG Knee Complete 4 Views Left  Primary osteoarthritis of left knee - Plan: meloxicam (MOBIC) 7.5 MG tablet  Placed in a hinged knee brace that felt good to her.  Suggested that she try tylenol and mobic if needed She will let me know if not improved soon  Signed Abbe Amsterdam, MD

## 2014-10-26 NOTE — Patient Instructions (Signed)
You do have some arthritis in your knee, and I wonder if you may have some damage to the cartilage.   I will let you know what the radiologist says Use the knee brace as needed Try tylenol for pain.  However you can also use the mobic when needed for more severe pain. Keep me up to date on how your knee is doing and let me know if we need to have you see an orthopedist for persistent pain.

## 2014-10-29 ENCOUNTER — Ambulatory Visit: Payer: Medicare Other | Admitting: Internal Medicine

## 2014-11-02 ENCOUNTER — Telehealth: Payer: Self-pay | Admitting: *Deleted

## 2014-11-02 NOTE — Telephone Encounter (Signed)
**Note Theresa-Identified via Obfuscation** [facility] - Client TELEPHONE ADVICE RECORD [facility] Patient Name: Theresa Terry Gender: Female DOB: December 01, 1950 Age: [age] Y 10 M 17 D Return Phone Number: [phone] (Primary), [phone] (Secondary) Address: [place]: [place] [address] Client [facility] - Client Client Site [facility] Day Physician Genella MechKollar, Elizabeth Contact Type Call Call Type Triage / Clinical Relationship To Patient Self Appointment Disposition EMR Appointment Attempted - Not Scheduled Info pasted into Epic Yes Return Phone Number [phone] (Primary) Chief Complaint Leg Pain Initial Comment Caller states she has knee pain and swelling for the last week PreDisposition Go to ED Nurse Assessment Nurse: Charna Elizabethrumbull, RN, Lynden Angathy Date/Time (Eastern Time): 10/26/2014 9:12:33 AM Confirm and document reason for call. If symptomatic, describe symptoms. ---Caller states she developed pain and swelling of her left knee about a week ago. No known injury. No fever. Has the patient traveled out of the country within the last 30 days? ---No Does the patient require triage? ---Yes Related visit to physician within the last 2 weeks? ---No Does the PT have any chronic conditions? (i.e. diabetes, asthma, etc.) ---Yes List chronic conditions. ---High Blood Pressure, Asthma? Guidelines Guideline Title Affirmed Question Affirmed Notes Nurse Date/Time Lamount Cohen(Eastern Time) Knee Pain [1] Thigh or calf pain AND [2] only 1 side AND [3] present > 1 hour Charna Elizabethrumbull, RN, [facility] 10/26/2014 9:14:33 AM Disp. Time Lamount Cohen(Eastern Time) Disposition Final User 10/26/2014 9:16:05 AM See Physician within 4 Hours (or PCP triage) Yes Trumbull, RN, Frann Riderathy Caller Understands: Yes Disagree/Comply: Comply PLEASE NOTE: All timestamps contained within this report are represented as [place] Standard Time. CONFIDENTIALTY NOTICE: This fax transmission is intended only for the  addressee. It contains information that is legally privileged, confidential or otherwise protected from use or disclosure. If you are not the intended recipient, you are strictly prohibited from reviewing, disclosing, copying using or disseminating any of this information or taking any action in reliance on or regarding this information. If you have received this fax in error, please notify us immediately by telephone so that we can arrange for its return to us. Phone: [phone], Toll-Free: [phone], Fax: [phone] Page: 2 of 2 Call Id: [id number] Care Advice Given Per Guideline SEE PHYSICIAN WITHIN 4 HOURS (or PCP triage): * IF NO PCP TRIAGE: You need to be seen. Go to _______________ (ED/UCC or office if it will be open) within the next 3 or 4 hours. Go sooner if you become worse. PAIN MEDICINES: ACETAMINOPHEN (E.G., TYLENOL): CALL EMS IF: Chest pain or shortness of breath occurs. CARE ADVICE given per Knee Pain (Adult) guideline. After Care Instructions Given Call Event Type User Date / Time Description

## 2014-11-12 ENCOUNTER — Ambulatory Visit (INDEPENDENT_AMBULATORY_CARE_PROVIDER_SITE_OTHER): Payer: Medicare Other | Admitting: Internal Medicine

## 2014-11-12 DIAGNOSIS — R0602 Shortness of breath: Secondary | ICD-10-CM | POA: Diagnosis not present

## 2014-11-12 LAB — PULMONARY FUNCTION TEST
DL/VA % pred: 117 %
DL/VA: 5.24 ml/min/mmHg/L
DLCO unc % pred: 107 %
DLCO unc: 22.39 ml/min/mmHg
FEF 25-75 POST: 1.68 L/s
FEF 25-75 PRE: 1.69 L/s
FEF2575-%Change-Post: 0 %
FEF2575-%PRED-POST: 100 %
FEF2575-%PRED-PRE: 101 %
FEV1-%CHANGE-POST: 0 %
FEV1-%PRED-PRE: 114 %
FEV1-%Pred-Post: 113 %
FEV1-Post: 1.95 L
FEV1-Pre: 1.97 L
FEV1FVC-%Change-Post: 4 %
FEV1FVC-%PRED-PRE: 99 %
FEV6-%Change-Post: -4 %
FEV6-%PRED-POST: 113 %
FEV6-%Pred-Pre: 118 %
FEV6-PRE: 2.53 L
FEV6-Post: 2.42 L
FEV6FVC-%CHANGE-POST: 0 %
FEV6FVC-%PRED-POST: 104 %
FEV6FVC-%Pred-Pre: 103 %
FVC-%Change-Post: -4 %
FVC-%PRED-PRE: 114 %
FVC-%Pred-Post: 109 %
FVC-POST: 2.42 L
FVC-Pre: 2.54 L
Post FEV1/FVC ratio: 81 %
Post FEV6/FVC ratio: 100 %
Pre FEV1/FVC ratio: 77 %
Pre FEV6/FVC Ratio: 99 %

## 2014-11-12 NOTE — Progress Notes (Signed)
PFT done today. 

## 2014-11-22 ENCOUNTER — Encounter: Payer: Self-pay | Admitting: Internal Medicine

## 2014-11-22 ENCOUNTER — Ambulatory Visit (INDEPENDENT_AMBULATORY_CARE_PROVIDER_SITE_OTHER): Payer: Medicare Other | Admitting: Internal Medicine

## 2014-11-22 VITALS — BP 120/84 | HR 97 | Temp 97.9°F | Resp 12 | Wt 164.1 lb

## 2014-11-22 DIAGNOSIS — R058 Other specified cough: Secondary | ICD-10-CM

## 2014-11-22 DIAGNOSIS — M25562 Pain in left knee: Secondary | ICD-10-CM | POA: Diagnosis not present

## 2014-11-22 DIAGNOSIS — R05 Cough: Secondary | ICD-10-CM

## 2014-11-22 MED ORDER — FLUTICASONE PROPIONATE 50 MCG/ACT NA SUSP: 2.0000 | Freq: Every day | NASAL | Status: DC

## 2014-11-22 NOTE — Progress Notes (Signed)
**Note Theresa-Identified via Obfuscation**    Subjective:    Patient ID: Theresa Terry, female    DOB: January 12, 1951, 64 y.o.   MRN: [id number]  HPI The patient is a 64 YO female who is coming in today for follow up on her knee pain (seen at urgent care and given meloxicam). She also wants to talk about the results of her recent lung testing. She is not having SOB anymore but still feels like she wheezes and coughs sometimes at night and in the morning. She tries propping herself up but this does not really help. Denies fevers or chills. Non-smoker. Worked in the [employer] for many years. Doing better with her anxiety and working with therapy.   Review of Systems  Constitutional: Negative for fever, activity change, appetite change, fatigue and unexpected weight change.  Respiratory: Positive for cough and wheezing. Negative for chest tightness and shortness of breath.   Cardiovascular: Negative for chest pain, palpitations and leg swelling.  Gastrointestinal: Negative for nausea, abdominal pain, diarrhea, constipation and abdominal distention.  Musculoskeletal: Positive for arthralgias. Negative for myalgias and back pain.  Skin: Negative.   Neurological: Negative for weakness and headaches.  Psychiatric/Behavioral: Positive for sleep disturbance and decreased concentration. Negative for suicidal ideas and self-injury. The patient is nervous/anxious.       Objective:   Physical Exam  Constitutional: She is oriented to person, place, and time. She appears well-developed and well-nourished.  Overweight  HENT:  Head: Normocephalic and atraumatic.  Eyes: EOM are normal.  Neck: Normal range of motion.  Cardiovascular: Normal rate and regular rhythm.   Pulmonary/Chest: Effort normal and breath sounds normal. No respiratory distress. She has no wheezes. She has no rales. She exhibits no tenderness.  Abdominal: Soft. Bowel sounds are normal.  Musculoskeletal: She exhibits no edema.  Neurological: She is alert and oriented to person,  place, and time.  Skin: Skin is warm and dry.   Filed Vitals:   11/22/14 1032  BP: 120/84  Pulse: 97  Temp: 97.9 F (36.6 C)  TempSrc: Oral  Resp: 12  Weight: 164 lb 1.9 oz (74.444 kg)  SpO2: 99%      Assessment & Plan:

## 2014-11-22 NOTE — Patient Instructions (Signed)
We have sent in flonase which is a nose spray to help with the cough. Use 2 puffs in each nostril once a day. If you are not noticing a difference in the cough in 2-3 weeks call the office back and we will try treating the acid reflux.  The blood pressure is doing good today likely from the weight loss so keep up the good work for that.   Come back in about 6 months if you are doing well.  You can keep using the tylenol for the knee pain.

## 2014-11-22 NOTE — Assessment & Plan Note (Signed)
Most likely etiology from arthritis and she is using tylenol successfully. Advised to continue and avoid injury.

## 2014-11-22 NOTE — Assessment & Plan Note (Addendum)
This is a new diagnosis now that we have excluded asthma and COPD with the PFTs. She does have some acid reflux but complains of nasal drainage as well. Will start with most likely and treat her nasal drainage with flonase. If no improvement in 2-3 weeks try treatment for GERD. Went over PFTs with her and no signs of asthma or COPD. Will stop her flovent inhaler as she does not need it. Can keep albuterol prn and talked to her about when she might need it. Lungs clear on exam and no signs of bronchitis or infection.

## 2014-11-22 NOTE — Progress Notes (Signed)
Pre visit review using our clinic review tool, if applicable. No additional management support is needed unless otherwise documented below in the visit note. 

## 2014-12-24 ENCOUNTER — Ambulatory Visit (INDEPENDENT_AMBULATORY_CARE_PROVIDER_SITE_OTHER): Payer: Medicare Other | Admitting: Physician Assistant

## 2014-12-24 ENCOUNTER — Telehealth: Payer: Self-pay | Admitting: Internal Medicine

## 2014-12-24 VITALS — BP 130/100 | HR 88 | Temp 98.3°F | Resp 18 | Ht 60.5 in | Wt 169.4 lb

## 2014-12-24 DIAGNOSIS — R062 Wheezing: Secondary | ICD-10-CM | POA: Diagnosis not present

## 2014-12-24 DIAGNOSIS — R059 Cough, unspecified: Secondary | ICD-10-CM

## 2014-12-24 DIAGNOSIS — J309 Allergic rhinitis, unspecified: Secondary | ICD-10-CM | POA: Diagnosis not present

## 2014-12-24 DIAGNOSIS — R0602 Shortness of breath: Secondary | ICD-10-CM

## 2014-12-24 DIAGNOSIS — R0989 Other specified symptoms and signs involving the circulatory and respiratory systems: Secondary | ICD-10-CM

## 2014-12-24 DIAGNOSIS — R05 Cough: Secondary | ICD-10-CM | POA: Diagnosis not present

## 2014-12-24 MED ORDER — BENZONATATE 200 MG PO CAPS
200.0000 mg | ORAL_CAPSULE | Freq: Three times a day (TID) | ORAL | Status: DC | PRN
Start: 2014-12-24 — End: 2015-02-21

## 2014-12-24 MED ORDER — IPRATROPIUM BROMIDE 0.02 % IN SOLN
0.5000 mg | Freq: Once | RESPIRATORY_TRACT | Status: AC
  Administered 2014-12-24: 0.5 mg via RESPIRATORY_TRACT

## 2014-12-24 MED ORDER — ALBUTEROL SULFATE HFA 108 (90 BASE) MCG/ACT IN AERS: 1.0000 | INHALATION_SPRAY | Freq: Four times a day (QID) | RESPIRATORY_TRACT | Status: DC | PRN

## 2014-12-24 MED ORDER — BENZONATATE 200 MG PO CAPS: 200.0000 mg | ORAL_CAPSULE | Freq: Three times a day (TID) | ORAL | Status: DC | PRN

## 2014-12-24 MED ORDER — ALBUTEROL SULFATE (2.5 MG/3ML) 0.083% IN NEBU
2.5000 mg | INHALATION_SOLUTION | Freq: Once | RESPIRATORY_TRACT | Status: AC
  Administered 2014-12-24: 2.5 mg via RESPIRATORY_TRACT

## 2014-12-24 NOTE — Patient Instructions (Signed)
You were wheezing quite a bit in clinic. This resolved with 2 breathing treatments.  Please stop using the flovent and use the albuterol as needed at home for wheezing.  Allergies could be the cause of the irritation and wheezing, please use the claritin daily and flonase 2 sprays in each nostril daily.  Tessalon every 8 hours as needed for cough may help.  Follow up with your physician next week.

## 2014-12-24 NOTE — Telephone Encounter (Signed)
Tried to reach patient, no answer

## 2014-12-24 NOTE — Telephone Encounter (Signed)
States she is having mucus in chest and cough.  Patient states she has taken OTC meds but its not working.  States she has spoken to Dr. Dorise Hiss about this.  She is requesting something to relieve mucus and cough to be [facility].     Patient also states insurance will not cover flovent.  Patient is requesting an inhaler that would be similar to be sent to pharmacy.

## 2014-12-24 NOTE — Telephone Encounter (Signed)
Sent in tessalon perles for cough, can take 1 up to 3 times a day for cough. She does not need flovent inhaler and told her that at last visit. Do recommend that she use the nose spray flonase as this will help with her cough as we discussed. If not better I did recommend we try treatment for acid reflux.

## 2014-12-24 NOTE — Progress Notes (Signed)
**Note Theresa-Identified via Obfuscation** Subjective:    Patient ID: Theresa Terry, female    DOB: Oct 04, 1950, 64 y.o.   MRN: [id number]  Chief Complaint  Patient presents with  . Cough    C/O chest congestion & cough since this weekend. Has noticed some wheezing & SOB at times   Patient Active Problem List   Diagnosis Date Noted  . Left knee pain 11/22/2014  . Upper airway cough syndrome 10/04/2014  . Anxiety state 10/04/2014  . Essential hypertension 10/04/2014  . Obesity 10/04/2014   Prior to Admission medications   Medication Sig Start Date End Date Taking? Authorizing Provider  albuterol (PROVENTIL HFA;VENTOLIN HFA) 108 (90 BASE) MCG/ACT inhaler Inhale 1-2 puffs into the lungs every 6 (six) hours as needed for wheezing. 12/21/12  Yes Adlih Moreno-Coll, MD  fluticasone (FLONASE) 50 MCG/ACT nasal spray Place 2 sprays into both nostrils daily. 11/22/14  Yes Judie BonusElizabeth A Kollar, MD  loratadine (CLARITIN) 10 MG tablet Take 10 mg by mouth daily.   Yes Historical Provider, MD  triamterene-hydrochlorothiazide (MAXZIDE-25) 37.5-25 MG per tablet Take 1 each (1 tablet total) by mouth daily. 05/28/12  Yes Lattie HawStephen A Beese, MD  benzonatate (TESSALON) 200 MG capsule Take 1 capsule (200 mg total) by mouth 3 (three) times daily as needed for cough. Take as needed for cough Patient not taking: Reported on 12/24/2014 12/24/14   Judie BonusElizabeth A Kollar, MD   Medications, allergies, past medical history, surgical history, family history, social history and problem list reviewed and updated.  HPI  [age] yof presents with cough, chest congestion, wheezing, and sob.   Sx started 2 wks ago with cough and occasional wheeze. Coughing throughout day. Non prod. Has felt chest congestion and rattling past couple days. Denies fevers, chills. Denies cp or doe.   Never been a smoker.   Looking through notes it appears pt saw her pcp Dr Dorise HissKollar last month for similar issue. Diagnosed with upper airway cough syndrome. Per notes she excluded asthma and copd due to  normal pfts. Started on flonase. Pt was instructed to stop flovent inhaler and use her albuterol prn. Today pt states she is in fact still using her flovent inhaler prn and not using albuterol. She has not been taking claritin or flonase daily.   Review of Systems See HPI.     Objective:   Physical Exam  Constitutional: She appears well-developed and well-nourished.  Non-toxic appearance. She does not have a sickly appearance. She does not appear ill. No distress.  BP 130/100 mmHg  Pulse 88  Temp(Src) 98.3 F (36.8 C) (Oral)  Resp 18  Ht 5' 0.5" (1.537 m)  Wt 169 lb 6 oz (76.828 kg)  BMI 32.52 kg/m2  SpO2 97%  PF 350 L/min   HENT:  Right Ear: Tympanic membrane normal.  Left Ear: Tympanic membrane normal.  Mouth/Throat: Uvula is midline, oropharynx is clear and moist and mucous membranes are normal.  Pulmonary/Chest: Effort normal. No tachypnea. She has no decreased breath sounds. She has wheezes in the right upper field, the right middle field, the right lower field, the left upper field, the left middle field and the left lower field. She has no rhonchi. She has no rales.  Diffuse expiratory wheezing.   Lymphadenopathy:       Head (right side): No submental, no submandibular and no tonsillar adenopathy present.       Head (left side): No submental, no submandibular and no tonsillar adenopathy present.    She has no cervical adenopathy.   Peak  flow pre duoneb: 350  Lung sounds post duoneb: Improved but still diffuse wheezing Lung sounds post 2nd duoneb:  Peak flow post duoneb: 350      Assessment & Plan:   70 yof presents with cough, chest congestion, wheezing, and sob.   Allergic rhinitis, unspecified allergic rhinitis type Cough - Plan: benzonatate (TESSALON) 200 MG capsule Wheezing - Plan: albuterol (PROVENTIL) (2.5 MG/3ML) 0.083% nebulizer solution 2.5 mg, ipratropium (ATROVENT) nebulizer solution 0.5 mg, albuterol (PROVENTIL HFA;VENTOLIN HFA) 108 (90 BASE) MCG/ACT  inhaler, albuterol (PROVENTIL) (2.5 MG/3ML) 0.083% nebulizer solution 2.5 mg, ipratropium (ATROVENT) nebulizer solution 0.5 mg Chest congestion --cough likely due to diffuse wheezing from likely postnasal drip, pt also has diagnosis of upper airway cough syndrome on problem list --normal vitals, no rales on exam low suspicion for pneumonia --duoneb x2 in clinic, wheezing/sob/cough improved  --need to do claritin/flonase daily for allergic rhinitis which could be cause of postnasal drip, tessalon tid prn for cough --albuterol refilled prn for home, reiterated dc flovent and using albuterol prn at home for wheezing --f/u with pcp one week, sooner or er if wheezing persists despite prn albuterol tx  Donnajean Lopes, PA-C Physician Assistant-Certified [facility]  12/24/2014 7:29 PM

## 2014-12-25 ENCOUNTER — Other Ambulatory Visit: Payer: Self-pay | Admitting: Physician Assistant

## 2014-12-25 ENCOUNTER — Telehealth: Payer: Self-pay

## 2014-12-25 DIAGNOSIS — R059 Cough, unspecified: Secondary | ICD-10-CM

## 2014-12-25 DIAGNOSIS — R05 Cough: Secondary | ICD-10-CM

## 2014-12-25 DIAGNOSIS — R062 Wheezing: Secondary | ICD-10-CM

## 2014-12-25 MED ORDER — PREDNISONE 20 MG PO TABS: 40.0000 mg | ORAL_TABLET | Freq: Every day | ORAL | Status: AC

## 2014-12-25 NOTE — Telephone Encounter (Signed)
Patients daughter called in stating that her mother is worse, she has wheezing and really bad cough and congestion still. Would like to have something called in to help with this besides the nose spray and benzonatate (TESSALON) 200 MG capsule because they are not working at all. ° °The call back number is [phone] ° °

## 2014-12-25 NOTE — Progress Notes (Signed)
Spoke with patient with regard to worsening cough.  She denies SOB but continues to wheeze.  She had wheezing on yesterday's exam and this improved with duonebs, however patient reports getting up this morning and "was back to square one."  Tessalon not helping. Patient asking for prednisone.  Labs checked and I can see no reason for her not to have a short course.  Deliah BostonMichael Clark, MS, PA-C   8:02 PM, 12/25/2014

## 2014-12-25 NOTE — Telephone Encounter (Signed)
Patients daughter called in stating that her mother is worse, she has wheezing and really bad cough and congestion still. Would like to have something called in to help with this besides the nose spray and benzonatate (TESSALON) 200 MG capsule because they are not working at all.  The call back number is [phone]

## 2015-01-18 ENCOUNTER — Telehealth: Payer: Self-pay | Admitting: Internal Medicine

## 2015-01-18 NOTE — Telephone Encounter (Signed)
Left message for patient to call me back. 

## 2015-01-18 NOTE — Telephone Encounter (Signed)
Patient ask if you coud send something to her pharmacy to break up the congestion in her chest, if so send to please advise [facility] - [place] ([place]), [place] - [address] [phone] (Phone) [phone] (Fax)

## 2015-01-21 ENCOUNTER — Other Ambulatory Visit: Payer: Self-pay | Admitting: Geriatric Medicine

## 2015-01-21 ENCOUNTER — Ambulatory Visit (INDEPENDENT_AMBULATORY_CARE_PROVIDER_SITE_OTHER): Payer: Medicare Other | Admitting: Internal Medicine

## 2015-01-21 ENCOUNTER — Encounter: Payer: Self-pay | Admitting: Internal Medicine

## 2015-01-21 VITALS — BP 148/82 | HR 84 | Temp 98.7°F | Resp 16 | Wt 169.0 lb

## 2015-01-21 DIAGNOSIS — J209 Acute bronchitis, unspecified: Secondary | ICD-10-CM | POA: Diagnosis not present

## 2015-01-21 MED ORDER — TRIAMTERENE-HCTZ 37.5-25 MG PO TABS: 1.0000 | ORAL_TABLET | Freq: Every day | ORAL | Status: DC

## 2015-01-21 MED ORDER — AMOXICILLIN 500 MG PO CAPS: 500.0000 mg | ORAL_CAPSULE | Freq: Three times a day (TID) | ORAL | Status: DC

## 2015-01-21 MED ORDER — PREDNISONE 10 MG PO TABS: ORAL_TABLET | ORAL | Status: DC

## 2015-01-21 NOTE — Telephone Encounter (Signed)
Patient did return phone call.  Patient is going to make appointment.  Patient states she also needs triamterene hydrochorothiazide sent to [facility] on [address].

## 2015-01-21 NOTE — Telephone Encounter (Signed)
Sent to pharmacy 

## 2015-01-21 NOTE — Patient Instructions (Signed)
To use Breo: Pull cap down to release medication. Blow out as much as possible then inhale powder as deeply as possible. Hold breath to count of ten then exhale.  Gargle and spit after use. Lot #: [id number] Expiration date:2/18

## 2015-01-21 NOTE — Progress Notes (Signed)
Pre visit review using our clinic review tool, if applicable. No additional management support is needed unless otherwise documented below in the visit note. 

## 2015-01-21 NOTE — Progress Notes (Signed)
   Subjective:    Patient ID: Theresa Terry, female    DOB: December 05, 1950, 64 y.o.   MRN: [id number]  HPI Symptoms began 5-6 days ago as increased wheezing. She increased her albuterol use up to 3 times a day over the weekend 7/23 and 7/24. She describes cough with yellow sputum. She denies any associated shortness of breath.  She also denies other symptoms of upper respiratory tract infection or extrinsic symptoms.  She does not smoke. Reactive airways disease was diagnosed in 2014. She is not a diabetic   Review of Systems She denies itchy, watery eyes.Frontal headache, facial pain , nasal purulence, dental pain, sore throat , otic pain or otic discharge denied. No fever , chills or sweats.     Objective:   Physical Exam  General appearance:Adequately nourished; no acute distress or increased work of breathing is present.    Lymphatic: No  lymphadenopathy about the head, neck, or axilla .  Eyes: No conjunctival inflammation or lid edema is present. There is no scleral icterus.  Ears:  External ear exam shows no significant lesions or deformities.  Otoscopic examination reveals clear canals, tympanic membranes are intact bilaterally without bulging, retraction, inflammation or discharge.  Nose:  External nasal examination shows no deformity or inflammation. Nasal mucosa are slightly erythematous and dry without lesions or exudates No septal dislocation or deviation.No obstruction to airflow.   Oral exam: Dental hygiene is good; lips and gums are healthy appearing.There is no oropharyngeal erythema or exudate .  Neck:  No deformities, thyromegaly, masses, or tenderness noted.   Supple with full range of motion without pain.   Heart:  Normal rate and regular rhythm. S1 and S2 normal without gallop, murmur, click, or rub . S4.   Lungs:Chest clear to auscultation; no wheezes, rhonchi,rales ,or rubs present. She has a slightly rattly and barking cough without sputum production.  Expiratory wheezing noted only during instruction of technique and initiation of Breo sample  Extremities:  No cyanosis, edema, or clubbing  noted    Skin: Warm & dry w/o tenting . No significant lesions or rash.  Accentuated curvature of the upper thoracic spine        Assessment & Plan:  #1 acute bronchitis with bronchospasm  See orders

## 2015-02-21 ENCOUNTER — Telehealth: Payer: Self-pay | Admitting: Internal Medicine

## 2015-02-21 DIAGNOSIS — R05 Cough: Secondary | ICD-10-CM

## 2015-02-21 DIAGNOSIS — R059 Cough, unspecified: Secondary | ICD-10-CM

## 2015-02-21 MED ORDER — BENZONATATE 200 MG PO CAPS: 200.0000 mg | ORAL_CAPSULE | Freq: Three times a day (TID) | ORAL | Status: DC | PRN

## 2015-02-21 NOTE — Telephone Encounter (Signed)
Refill of her tessalon perles sent in and recommend her to start the flonase and claritin if she is not taking them. If no improvement by Monday come for visit.

## 2015-02-21 NOTE — Telephone Encounter (Signed)
Patient stated her symptoms have come back, coughing really bad, mucus is coming up wheezing is throughout the day, please advise on what to do.

## 2015-02-24 ENCOUNTER — Ambulatory Visit (INDEPENDENT_AMBULATORY_CARE_PROVIDER_SITE_OTHER): Payer: Medicare Other | Admitting: Emergency Medicine

## 2015-02-24 ENCOUNTER — Ambulatory Visit (INDEPENDENT_AMBULATORY_CARE_PROVIDER_SITE_OTHER): Payer: Medicare Other

## 2015-02-24 VITALS — BP 126/78 | HR 81 | Temp 98.1°F | Resp 16 | Ht 60.5 in | Wt 168.2 lb

## 2015-02-24 DIAGNOSIS — R05 Cough: Secondary | ICD-10-CM

## 2015-02-24 DIAGNOSIS — R062 Wheezing: Secondary | ICD-10-CM

## 2015-02-24 DIAGNOSIS — J209 Acute bronchitis, unspecified: Secondary | ICD-10-CM

## 2015-02-24 DIAGNOSIS — R059 Cough, unspecified: Secondary | ICD-10-CM

## 2015-02-24 LAB — POCT CBC
Granulocyte percent: 67.5 %G (ref 37–80)
HEMATOCRIT: 42.8 % (ref 37.7–47.9)
Hemoglobin: 13.8 g/dL (ref 12.2–16.2)
LYMPH, POC: 2.3 (ref 0.6–3.4)
MCH, POC: 29.1 pg (ref 27–31.2)
MCHC: 32.3 g/dL (ref 31.8–35.4)
MCV: 90.1 fL (ref 80–97)
MID (cbc): 0.4 (ref 0–0.9)
MPV: 7.1 fL (ref 0–99.8)
POC GRANULOCYTE: 5.5 (ref 2–6.9)
POC LYMPH %: 28.1 % (ref 10–50)
POC MID %: 4.4 %M (ref 0–12)
Platelet Count, POC: 315 10*3/uL (ref 142–424)
RBC: 4.75 M/uL (ref 4.04–5.48)
RDW, POC: 13.5 %
WBC: 8.2 10*3/uL (ref 4.6–10.2)

## 2015-02-24 MED ORDER — PREDNISONE 10 MG PO TABS: ORAL_TABLET | ORAL | Status: DC

## 2015-02-24 MED ORDER — ALBUTEROL SULFATE (2.5 MG/3ML) 0.083% IN NEBU
2.5000 mg | INHALATION_SOLUTION | Freq: Once | RESPIRATORY_TRACT | Status: AC
Start: 2015-02-24 — End: 2015-02-24
  Administered 2015-02-24: 2.5 mg via RESPIRATORY_TRACT

## 2015-02-24 MED ORDER — IPRATROPIUM BROMIDE 0.02 % IN SOLN
0.5000 mg | Freq: Once | RESPIRATORY_TRACT | Status: AC
  Administered 2015-02-24: 0.5 mg via RESPIRATORY_TRACT

## 2015-02-24 MED ORDER — ALBUTEROL SULFATE HFA 108 (90 BASE) MCG/ACT IN AERS: 1.0000 | INHALATION_SPRAY | Freq: Four times a day (QID) | RESPIRATORY_TRACT | Status: DC | PRN

## 2015-02-24 MED ORDER — BECLOMETHASONE DIPROPIONATE 80 MCG/ACT IN AERS: INHALATION_SPRAY | RESPIRATORY_TRACT | Status: DC

## 2015-02-24 NOTE — Progress Notes (Addendum)
This chart was scribed for Lesle Chris, MD by Broadus John, Medical Scribe. This patient was seen in Room 2 and the patient's care was started at 9:29 AM.  Chief Complaint:  Chief Complaint  Patient presents with  . Cough    x 1 month with yellow congestion at time  . Wheezing    wheezing with the cough x 1 month.     HPI: Theresa Terry is a 64 y.o. female who reports to [facility] today complaining of intermittent cough with occasional yellow productivity, onset one month ago.  Pt reports associates symptoms of wheezing. She indicates that the symptoms have gotten worse this past week.  Pt states that she was put on antibiotics and cortisone previously for the symptoms. She also notes that she has a Proventil inhaler that occasionally helps her symptoms. She does state that she had Chest X-rays taken on her.  Pt denies having a history of asthma, or being a smoker. Pt works as a Financial trader, and works chemicals frequently.    Past Medical History  Diagnosis Date  . Hypertension   . Asthma   . Arthritis   . GERD (gastroesophageal reflux disease)   . Allergy   . Cataract    Past Surgical History  Procedure Laterality Date  . Abdominal hysterectomy    . Tonsillectomy and adenoidectomy  1961   Social History   Social History  . Marital Status: Legally Separated    Spouse Name: N/A  . Number of Children: N/A  . Years of Education: N/A   Social History Main Topics  . Smoking status: Never Smoker   . Smokeless tobacco: None  . Alcohol Use: 0.0 oz/week    0 Standard drinks or equivalent per week  . Drug Use: No  . Sexual Activity: Not Asked   Other Topics Concern  . None   Social History Narrative   Family History  Problem Relation Age of Onset  . Hypertension Mother   . Heart failure Mother   . Alcohol abuse Mother   . Heart disease Mother   . Alcohol abuse Father   . Heart disease Father   . Alcohol abuse Brother   . Heart disease Maternal Aunt   .  Heart disease Maternal Uncle    No Known Allergies Prior to Admission medications   Medication Sig Start Date End Date Taking? Authorizing Provider  albuterol (PROVENTIL HFA;VENTOLIN HFA) 108 (90 BASE) MCG/ACT inhaler Inhale 1-2 puffs into the lungs every 6 (six) hours as needed for wheezing. 12/24/14  Yes Todd McVeigh, PA  benzonatate (TESSALON) 200 MG capsule Take 1 capsule (200 mg total) by mouth 3 (three) times daily as needed for cough. Take as needed for cough 02/21/15  Yes Judie Bonus, MD  fluticasone [facility]) 50 MCG/ACT nasal spray Place 2 sprays into both nostrils daily. 11/22/14  Yes Judie Bonus, MD  loratadine (CLARITIN) 10 MG tablet Take 10 mg by mouth daily.   Yes Historical Provider, MD  triamterene-hydrochlorothiazide (MAXZIDE-25) 37.5-25 MG per tablet Take 1 tablet by mouth daily. 01/21/15  Yes Judie Bonus, MD  amoxicillin (AMOXIL) 500 MG capsule Take 1 capsule (500 mg total) by mouth 3 (three) times daily. Patient not taking: Reported on 02/24/2015 01/21/15   Pecola Lawless, MD  predniSONE (DELTASONE) 10 MG tablet 1 tid pc Patient not taking: Reported on 02/24/2015 01/21/15   Pecola Lawless, MD     ROS: The patient has cough, wheezes.  All other systems have been reviewed and were otherwise negative with the exception of those mentioned in the HPI and as above.    PHYSICAL EXAM: Filed Vitals:   02/24/15 0911  BP: 126/78  Pulse: 81  Temp: 98.1 F (36.7 C)  Resp: 16   Body mass index is 32.31 kg/(m^2).   General: Alert, no acute distress HEENT:  Normocephalic, atraumatic, oropharynx patent. Eye: Nonie Hoyer [facility] Cardiovascular:  Regular rate and rhythm, no rubs murmurs or gallops.  No Carotid bruits, radial pulse intact. No pedal edema.  Respiratory: Clear to auscultation bilaterally.  No wheezes, rales, or rhonchi.  No cyanosis, no use of accessory musculature Abdominal: No organomegaly, abdomen is soft and non-tender, positive bowel sounds.  No  masses. Musculoskeletal: Gait intact. No edema, tenderness Skin: No rashes. Neurologic: Facial musculature symmetric. Psychiatric: Patient acts appropriately throughout our interaction. Lymphatic: No cervical or submandibular lymphadenopathy Genitourinary/Anorectal: No acute findings    LABS: Results for orders placed or performed in visit on 11/12/14  Pulmonary function test  Result Value Ref Range   FVC-Pre 2.54 L   FVC-%Pred-Pre 114 %   FVC-Post 2.42 L   FVC-%Pred-Post 109 %   FVC-%Change-Post -4 %   FEV1-Pre 1.97 L   FEV1-%Pred-Pre 114 %   FEV1-Post 1.95 L   FEV1-%Pred-Post 113 %   FEV1-%Change-Post 0 %   FEV6-Pre 2.53 L   FEV6-%Pred-Pre 118 %   FEV6-Post 2.42 L   FEV6-%Pred-Post 113 %   FEV6-%Change-Post -4 %   Pre FEV1/FVC ratio 77 %   FEV1FVC-%Pred-Pre 99 %   Post FEV1/FVC ratio 81 %   FEV1FVC-%Change-Post 4 %   Pre FEV6/FVC Ratio 99 %   FEV6FVC-%Pred-Pre 103 %   Post FEV6/FVC ratio 100 %   FEV6FVC-%Pred-Post 104 %   FEV6FVC-%Change-Post 0 %   FEF 25-75 Pre 1.69 L/sec   FEF2575-%Pred-Pre 101 %   FEF 25-75 Post 1.68 L/sec   FEF2575-%Pred-Post 100 %   FEF2575-%Change-Post 0 %   DLCO unc 22.39 ml/min/mmHg   DLCO unc % pred 107 %   DL/VA 1.61 ml/min/mmHg/L   DL/VA % pred 096 %     EKG/XRAY:   Primary read interpreted by Dr. Cleta Alberts at [facility]. There is no pneumonic infiltrate. There are no consolidated areas. Heart size is borderline enlarged.   ASSESSMENT/PLAN:  I placed the patient on a prednisone taper as well as Qvar twice a day. She will continue her albuterol inhaler. Heart size is borderline increased on chest x-ray I suspect secondary to hypertensive disease. Referral made to pulmonary for their opinion regarding her cough. I personally performed the services described in this documentation, which was scribed in my presence. The recorded information has been reviewed and is accurate.  Gross sideeffects, risk and benefits, and alternatives of medications  d/w patient. Patient is aware that all medications have potential sideeffects and we are unable to predict every sideeffect or drug-drug interaction that may occur.  Lesle Chris MD 02/24/2015 9:29 AM

## 2015-02-24 NOTE — Progress Notes (Deleted)
   Subjective:    Patient ID: Theresa Terry, female    DOB: Nov 10, 1950, 64 y.o.   MRN: [id number]  HPI    Review of Systems     Objective:   Physical Exam        Assessment & Plan:

## 2015-02-24 NOTE — Patient Instructions (Signed)
Bronchospasm A bronchospasm is when the tubes that carry air in and out of your lungs (airways) spasm or tighten. During a bronchospasm it is hard to breathe. This is because the airways get smaller. A bronchospasm can be triggered by:  Allergies. These may be to animals, pollen, food, or mold.  Infection. This is a common cause of bronchospasm.  Exercise.  Irritants. These include pollution, cigarette smoke, strong odors, aerosol sprays, and paint fumes.  Weather changes.  Stress.  Being emotional. HOME CARE   Always have a plan for getting help. Know when to call your doctor and local emergency services (911 in the [place] Know where you can get emergency care.  Only take medicines as told by your doctor.  If you were prescribed an inhaler or nebulizer machine, ask your doctor how to use it correctly. Always use a spacer with your inhaler if you were given one.  Stay calm during an attack. Try to relax and breathe more slowly.  Control your home environment:  Change your heating and air conditioning filter at least once a month.  Limit your use of fireplaces and wood stoves.  Do not  smoke. Do not  allow smoking in your home.  Avoid perfumes and fragrances.  Get rid of pests (such as roaches and mice) and their droppings.  Throw away plants if you see mold on them.  Keep your house clean and dust free.  Replace carpet with wood, tile, or vinyl flooring. Carpet can trap dander and dust.  Use allergy-proof pillows, mattress covers, and box spring covers.  Wash bed sheets and blankets every week in hot water. Dry them in a dryer.  Use blankets that are made of polyester or cotton.  Wash hands frequently. GET HELP IF:  You have muscle aches.  You have chest pain.  The thick spit you spit or cough up (sputum) changes from clear or white to yellow, green, gray, or bloody.  The thick spit you spit or cough up gets thicker.  There are problems that may be related  to the medicine you are given such as:  A rash.  Itching.  Swelling.  Trouble breathing. GET HELP RIGHT AWAY IF:  You feel you cannot breathe or catch your breath.  You cannot stop coughing.  Your treatment is not helping you breathe better.  You have very bad chest pain. MAKE SURE YOU:   Understand these instructions.  Will watch your condition.  Will get help right away if you are not doing well or get worse. Document Released: 04/12/2009 Document Revised: 06/20/2013 Document Reviewed: 12/06/2012 ExitCare Patient Information 2015 [employer]. This information is not intended to replace advice given to you by your health care provider. Make sure you discuss any questions you have with your health care provider.  

## 2015-03-07 ENCOUNTER — Telehealth: Payer: Self-pay | Admitting: Pulmonary Disease

## 2015-03-07 ENCOUNTER — Encounter: Payer: Self-pay | Admitting: Pulmonary Disease

## 2015-03-07 ENCOUNTER — Ambulatory Visit (INDEPENDENT_AMBULATORY_CARE_PROVIDER_SITE_OTHER): Payer: Medicare Other | Admitting: Pulmonary Disease

## 2015-03-07 VITALS — BP 136/80 | HR 76 | Temp 97.2°F | Ht 65.0 in | Wt 164.2 lb

## 2015-03-07 DIAGNOSIS — J683 Other acute and subacute respiratory conditions due to chemicals, gases, fumes and vapors: Secondary | ICD-10-CM | POA: Insufficient documentation

## 2015-03-07 DIAGNOSIS — I10 Essential (primary) hypertension: Secondary | ICD-10-CM

## 2015-03-07 DIAGNOSIS — J4530 Mild persistent asthma, uncomplicated: Secondary | ICD-10-CM | POA: Diagnosis not present

## 2015-03-07 MED ORDER — PANTOPRAZOLE SODIUM 40 MG PO TBEC: 40.0000 mg | DELAYED_RELEASE_TABLET | Freq: Every day | ORAL | Status: DC

## 2015-03-07 MED ORDER — BUDESONIDE-FORMOTEROL FUMARATE 160-4.5 MCG/ACT IN AERO: 2.0000 | INHALATION_SPRAY | Freq: Two times a day (BID) | RESPIRATORY_TRACT | Status: DC

## 2015-03-07 NOTE — Telephone Encounter (Signed)
lmtcb

## 2015-03-07 NOTE — Patient Instructions (Signed)
Theresa Terry- it was nice meeting you today...  We reviewed your recent history, physical exam, CXR & breathing tests...    We discussed your REACTIVE AIRWAYS DISEASE & it's relation to asthma...  We decided to start therapy w/ SYMBICORT160- 2 sprays twice daily on a regular basis...    Continue to use your VENTOLIN 1-2sprays every 6H as needed (your rescue inhaler).    Try to avoid the upper resp infections that seem to be your main trigger...  We also discussed the importance of an ANTIREFLUX regimen to prevent the night-time and early morning cough/ congestion...    In this regard- start the new PROTONIX (Pantoprazole)  one tab taken 30 min before the evening meal,,,    Do not eat or drink much after dinner...    Elevate the head of your bed on 6" blocks as we discussed (very important)  Call for any questions...  Let's plan a follow up visit in 6-8 weeks, sooner if needed for problems.Marland KitchenMarland Kitchen

## 2015-03-07 NOTE — Progress Notes (Signed)
Subjective:     Patient ID: Theresa Terry, female   DOB: 07-Mar-1951, 64 y.o.   MRN: [id number]  HPI 64 y/o BF, referred by DrDaub for a pulmonary evaluation due to recurrent asthmatic bronchitis symptoms>       Theresa Terry is a never smoker w/ hx of recurrent upper resp infections that lead to persistent cough, chest congestion, wheezing, and dyspnea;  She notes that the infections are "hard to shake" and the symptoms "come right back when I finish the meds";  She has had OVs monthly since [id number] betw DrKollar, [facility], & DrHopper- over this time frame she has been treated w/ ZPak, Pred, Flovent then Qvar, Ventolin rescue, Claritin, Flonase, Tessalon;  She had similar problem in 2014 w/ several ER visits & she notes that all the meds seem to help but the symptoms come back when she stops them... The main trigger seems to be upper resp infections, BUT she also c/o cough & wheezing at nights & AM cough as well- she checked this out on-line & wonders if it could relate to her prob...       Makylie was prev employed at [employer] in the 1970s; she had a job in Print[employer] & worked a lot in [employer]- while it was very dusty she was not around the raw cotton/ carding room/ etc; she similarly denies any asbestos exposure or other known toxic exposures, currently employed cleaning houses...  FamHx is neg for lung problems...  EXAM reveals Afeb, VSS, O2sat=98% on RA;  HEENT- neg, mallampati1;  Chest- clear w/o w/r/r, no congestion, cough is dry;  Heart- RR w/o m/r/g;  Abd- soft, nontender, neg;  Ext- neg w/o c/c/e...  CXR 02/24/15 showed mild cardiomegaly, clear lungs, NAD and unchanged from old films...   PFT 11/12/14 showed FVC=2.54 (114), FEV1=1.97 (114), %1sec=77, mid-flows are wnl at 101% predicted; TLC & RV were mildly reduced and DLCO was wnl...   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 w/ her main triggers usually upper resp infections;  This pattern has been repeated over the yrs starting in ?2014 & she usually responds to antibiotics, Pred, inhaled bronchodil & ICS;  The pattern over the last several months is c/w this diagnosis & she finally improved w/ the Pred;  I believe that she would benefit from an ICS/LABA combination inhaler to use regularly & hopefully prevent the freq recurrences that tended to occur when she runs out of meds;  She stopped prev Flovent & never filled the Qvar due to cost ($200);  We checked in Epic & her insurance should cover SYMBICORT160- 2spBid;  She has VENTOLIN rescue inhaler to use 1-2sp Q6H prn;  Her other issue appears to be GERD/ reflux w/ nocturnal & early AM symptoms- we discussed a vigorous antireflux regimen w/ Protonix40, NPO after dinner, elev HOB 6" (see AVS);  She will call w/ questions or problems and we plan recheck in 6 weeks...    Past Medical History  Diagnosis Date  . Hypertension >> on Maxzide-25   . Asthma >> SEE ABOVE   . Arthritis   . GERD (gastroesophageal reflux disease) >> adding Protonix40 & antireflux regimen   . Allergy   . Cataract     Past Surgical History  Procedure Laterality Date  . Abdominal hysterectomy    . Tonsillectomy and adenoidectomy  1961    Outpatient Encounter Prescriptions as of 03/07/2015  Medication Sig  . albuterol (PROVENTIL HFA;VENTOLIN HFA) 108 (90 BASE) MCG/ACT inhaler Inhale 1-2 puffs into the lungs every 6 (six) hours as needed for wheezing.  . fluticasone (FLONASE) 50 MCG/ACT nasal spray Place 2 sprays into both nostrils daily.  Theresa Terry loratadine (CLARITIN) 10 MG tablet Take 10 mg by mouth daily.  . predniSONE (DELTASONE) 10 MG tablet Takes 6 a day for one day 5 a day for one day 4 a  day for one day 3 a day for one day 2 a day for one day one a day for one day.  . triamterene-hydrochlorothiazide (MAXZIDE-25) 37.5-25 MG per tablet Take 1 tablet by mouth daily.  .  [DISCONTINUED] predniSONE (DELTASONE) 10 MG tablet 1 tid pc  . beclomethasone (QVAR) 80 MCG/ACT inhaler Use one puff twice a day followed by rinsing your mouth with water. (Patient not taking: Reported on 03/07/2015)  . benzonatate (TESSALON) 200 MG capsule Take 1 capsule (200 mg total) by mouth 3 (three) times daily as needed for cough. Take as needed for cough (Patient not taking: Reported on 03/07/2015)  . budesonide-formoterol (SYMBICORT) 160-4.5 MCG/ACT inhaler Inhale 2 puffs into the lungs 2 (two) times daily.  . pantoprazole (PROTONIX) 40 MG tablet Take 1 tablet (40 mg total) by mouth daily. Take 30 minutes before dinner  . [DISCONTINUED] amoxicillin (AMOXIL) 500 MG capsule Take 1 capsule (500 mg total) by mouth 3 (three) times daily. (Patient not taking: Reported on 02/24/2015)   No facility-administered encounter medications on file as of 03/07/2015.    No Known Allergies    There is no immunization history on file for this patient. She needs to be sure that she gets all her needed vaccines & will check w/ her PCP.   Family History  Problem Relation Age of Onset  . Hypertension Mother   . Heart failure Mother   . Alcohol abuse Mother   . Heart disease Mother   . Alcohol abuse Father   . Heart disease Father   . Alcohol abuse Brother   . Heart disease Maternal Aunt   . Heart disease Maternal Uncle     Social History   Social History  . Marital Status: Legally Separated    Spouse Name: N/A  . Number of Children: N/A  . Years of Education: N/A   Occupational History  . Not on file.   Social History Main Topics  . Smoking status: Passive Smoke Exposure - Never Smoker  . Smokeless tobacco: Not on file  . Alcohol Use: 0.0 oz/week    0 Standard drinks or equivalent per week  . Drug Use: No  . Sexual Activity: Not on file   Other Topics Concern  . Not on file   Social History Narrative    Current Medications, Allergies, Past Medical History, Past Surgical History,  Family History, and Social History were reviewed in [employer] record.   Review of Systems            All symptoms NEG except where BOLDED >>  Constitutional:  F/C/S, fatigue, anorexia, unexpected weight change. HEENT:  HA, visual changes, hearing loss, earache, nasal symptoms, sore throat, mouth sores, hoarseness. Resp:  cough, sputum, hemoptysis; SOB, tightness, wheezing. Cardio:  CP, palpit, DOE, orthopnea, edema. GI:  N/V/D/C, blood in stool; reflux, abd pain, distention, gas. GU:  dysuria, freq, urgency, hematuria, flank pain, voiding difficulty. MS:  joint pain, swelling, tenderness, decr ROM; neck  pain, back pain, etc. Neuro:  HA, tremors, seizures, dizziness, syncope, weakness, numbness, gait abn. Skin:  suspicious lesions or skin rash. Heme:  adenopathy, bruising, bleeding. Psyche:  confusion, agitation, sleep disturbance, hallucinations, anxiety, depression suicidal.   Objective:   Physical Exam      Vital Signs:  Reviewed...  General:  WD, WN, 64 y/o BF in NAD; alert & oriented; pleasant & cooperative... HEENT:  [place]/AT; Conjunctiva- pink, Sclera- nonicteric, EOM-wnl, PERRLA, EACs-clear, TMs-wnl; NOSE-clear; THROAT-clear & wnl. Neck:  Supple w/ fairROM; no JVD; normal carotid impulses w/o bruits; no thyromegaly or nodules palpated; no lymphadenopathy. Chest:  Clear to P & A; without wheezes, rales, or rhonchi heard. Heart:  Regular Rhythm; norm S1 & S2 without murmurs, rubs, or gallops detected. Abdomen:  Soft & nontender- no guarding or rebound; normal bowel sounds; no organomegaly or masses palpated. Ext:  DecrROM; without deformities +arthritic changes; no varicose veins, venous insuffic, or edema;  Pulses intact w/o bruits. Neuro:  No focal neuro deficits; sensory testing normal; gait normal & balance OK. Derm:  No lesions noted; no rash etc. Lymph:  No cervical, supraclavicular, axillary, or inguinal adenopathy palpated.   Assessment:       IMP >>     RADS -- we discussed the pathophysiology & rec SYMBICORT160-2spBid regularly + VentolinHFA rescue prn...    GERD w/ prob LPR -- we discussed an antireflux regimen w/ Protonix/ NPO after dinner/ elev HOB 6"...  PLAN >>     Georgetta appears to have AB/ RADS w/ her main triggers usually upper resp infections;  This pattern has been repeated over the yrs starting in ?2014 & she usually responds to antibiotics, Pred, inhaled bronchodil & ICS;  The pattern over the last several months is c/w this diagnosis & she finally improved w/ the Pred;  I believe that she would benefit from an ICS/LABA combination inhaler to use regularly & hopefully prevent the freq recurrences that tended to occur when she runs out of meds;  She stopped prev Flovent & never filled the Qvar due to cost ($200);  We checked in Epic & her insurance should cover SYMBICORT160- 2spBid;  She has VENTOLIN rescue inhaler to use 1-2sp Q6H prn;  Her other issue appears to be GERD/ reflux w/ nocturnal & early AM symptoms- we discussed a vigorous antireflux regimen w/ Protonix40, NPO after dinner, elev HOB 6" (see AVS);  She will call w/ questions or problems and we plan recheck in 6 weeks.      Plan:     Patient's Medications  New Prescriptions   BUDESONIDE-FORMOTEROL (SYMBICORT) 160-4.5 MCG/ACT INHALER    Inhale 2 puffs into the lungs 2 (two) times daily.   PANTOPRAZOLE (PROTONIX) 40 MG TABLET    Take 1 tablet (40 mg total) by mouth daily. Take 30 minutes before dinner  Previous Medications   ALBUTEROL (PROVENTIL HFA;VENTOLIN HFA) 108 (90 BASE) MCG/ACT INHALER    Inhale 1-2 puffs into the lungs every 6 (six) hours as needed for wheezing.   BECLOMETHASONE (QVAR) 80 MCG/ACT INHALER        She never filled this (too $$$ on her plan)   BENZONATATE (TESSALON) 200 MG CAPSULE          She's OFF this med (says it didn't help)     FLUTICASONE (FLONASE) 50 MCG/ACT NASAL SPRAY    Place 2 sprays into both nostrils daily.   LORATADINE  (CLARITIN) 10 MG TABLET    Take 10 mg by mouth daily.  PREDNISONE (DELTASONE) 10 MG TABLET                       FINISHED & now OUT...   TRIAMTERENE-HYDROCHLOROTHIAZIDE (MAXZIDE-25) 37.5-25 MG PER TABLET    Take 1 tablet by mouth daily.  Modified Medications   No medications on file  Discontinued Medications   AMOXICILLIN (AMOXIL) 500 MG CAPSULE    Take 1 capsule (500 mg total) by mouth 3 (three) times daily.   PREDNISONE (DELTASONE) 10 MG TABLET    1 tid pc

## 2015-03-08 ENCOUNTER — Encounter: Payer: Self-pay | Admitting: Pulmonary Disease

## 2015-03-08 NOTE — Telephone Encounter (Signed)
Pt returning call and can be reached @ [phone].Caren Griffins

## 2015-03-08 NOTE — Telephone Encounter (Signed)
Called pt. She has already picked up RX's. Nothing further needed

## 2015-04-03 ENCOUNTER — Ambulatory Visit (INDEPENDENT_AMBULATORY_CARE_PROVIDER_SITE_OTHER): Payer: Medicare Other | Admitting: Internal Medicine

## 2015-04-03 ENCOUNTER — Encounter: Payer: Self-pay | Admitting: Internal Medicine

## 2015-04-03 VITALS — BP 110/78 | HR 74 | Temp 98.6°F | Resp 12 | Ht 62.0 in | Wt 166.0 lb

## 2015-04-03 DIAGNOSIS — I10 Essential (primary) hypertension: Secondary | ICD-10-CM | POA: Diagnosis not present

## 2015-04-03 DIAGNOSIS — F411 Generalized anxiety disorder: Secondary | ICD-10-CM | POA: Diagnosis not present

## 2015-04-03 NOTE — Progress Notes (Signed)
Pre visit review using our clinic review tool, if applicable. No additional management support is needed unless otherwise documented below in the visit note. 

## 2015-04-03 NOTE — Patient Instructions (Signed)
You can call us back when it is convenient for you to come in and get the pneumonia shot and the tetanus shot.   The pneumonia shot helps to give you immunity against pneumonia that lasts for the rest of your life.

## 2015-04-03 NOTE — Assessment & Plan Note (Signed)
BP well controlled on hctz. Reviewed last labs which were normal and no indication for change today.

## 2015-04-03 NOTE — Progress Notes (Signed)
   Subjective:    Patient ID: Theresa Terry, female    DOB: 04/01/1951, 64 y.o.   MRN: [id number]  HPI The patient is a 64 YO female coming in for follow up of her blood pressure and her anxiety. She is doing well on her blood pressure medicine. Has taken for many years. No side effects. Denies headaches, chest pains, SOB.  Her anxiety is doing better, this has been a good summer. Denies concerns with that now. Helps that her breathing has been well controlled. Not on any medicines right now and does not feel that she needs them right now. Has relaxation exercises that she does when needed. Tries to eat right and exercise for her mental health.   Review of Systems  Constitutional: Negative for fever, activity change, appetite change, fatigue and unexpected weight change.  HENT: Negative.   Eyes: Negative.   Respiratory: Negative for cough, chest tightness, shortness of breath and wheezing.   Cardiovascular: Negative for chest pain, palpitations and leg swelling.  Gastrointestinal: Negative for nausea, abdominal pain, diarrhea, constipation and abdominal distention.  Musculoskeletal: Negative.   Skin: Negative.   Neurological: Negative.  Negative for weakness and headaches.  Psychiatric/Behavioral: Positive for sleep disturbance. Negative for suicidal ideas, self-injury and decreased concentration. The patient is not nervous/anxious.       Objective:   Physical Exam  Constitutional: She is oriented to person, place, and time. She appears well-developed and well-nourished.  Overweight  HENT:  Head: Normocephalic and atraumatic.  Eyes: EOM are normal.  Neck: Normal range of motion.  Cardiovascular: Normal rate and regular rhythm.   Pulmonary/Chest: Effort normal and breath sounds normal. No respiratory distress. She has no wheezes. She has no rales. She exhibits no tenderness.  Abdominal: Soft. Bowel sounds are normal.  Musculoskeletal: She exhibits no edema.  Neurological: She is alert  and oriented to person, place, and time.  Skin: Skin is warm and dry.  Psychiatric: She has a normal mood and affect.   Filed Vitals:   04/03/15 0949  BP: 110/78  Pulse: 74  Temp: 98.6 F (37 C)  TempSrc: Oral  Resp: 12  Height:  (1.575 m)  Weight: 166 lb (75.297 kg)  SpO2: 96%      Assessment & Plan:

## 2015-04-03 NOTE — Assessment & Plan Note (Signed)
Doing well and no longer in counseling. Has made some changes to her lifestyle which has helped immensely. Will leave on problem list for now and if still inactive next visit will remove. Not on any medications for this. Feel that it is partially related to her breathing so hopefully we can keep that controlled.

## 2015-04-18 ENCOUNTER — Encounter: Payer: Self-pay | Admitting: Pulmonary Disease

## 2015-04-18 ENCOUNTER — Ambulatory Visit (INDEPENDENT_AMBULATORY_CARE_PROVIDER_SITE_OTHER): Payer: Medicare Other | Admitting: Pulmonary Disease

## 2015-04-18 VITALS — BP 116/74 | HR 88 | Temp 98.6°F | Wt 169.4 lb

## 2015-04-18 DIAGNOSIS — J683 Other acute and subacute respiratory conditions due to chemicals, gases, fumes and vapors: Secondary | ICD-10-CM

## 2015-04-18 DIAGNOSIS — J387 Other diseases of larynx: Secondary | ICD-10-CM

## 2015-04-18 DIAGNOSIS — K219 Gastro-esophageal reflux disease without esophagitis: Secondary | ICD-10-CM | POA: Insufficient documentation

## 2015-04-18 DIAGNOSIS — J4530 Mild persistent asthma, uncomplicated: Secondary | ICD-10-CM

## 2015-04-18 NOTE — Progress Notes (Signed)
Subjective:     Patient ID: De Burrsnnie Helder, female   DOB: 05-22-1951, 64 y.o.   MRN: [id number]  HPI  ~  March 07, 2015:  Initial pulmonary consult w/ SN>  64 y/o BF, referred by DrDaub for a pulmonary evaluation due to recurrent asthmatic bronchitis symptoms>       Theresa Terry is a never smoker w/ hx of recurrent upper resp infections that lead to persistent cough, chest congestion, wheezing, and dyspnea;  She notes that the infections are "hard to shake" and the symptoms "come right back when I finish the meds";  She has had OVs monthly since [id number] betw DrKollar, [facility], & DrHopper- over this time frame she has been treated w/ ZPak, Pred, Flovent then Qvar, Ventolin rescue, Claritin, Flonase, Tessalon;  She had similar problem in 2014 w/ several ER visits & she notes that all the meds seem to help but the symptoms come back when she stops them... The main trigger seems to be upper resp infections, BUT she also c/o cough & wheezing at nights & AM cough as well- she checked this out on-line & wonders if it could relate to her prob...       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  FamHx is neg for lung problems...      EXAM reveals Afeb, VSS, O2sat=98% on RA;  HEENT- neg, mallampati1;  Chest- clear w/o w/r/r, no congestion, cough is dry;  Heart- RR w/o m/r/g;  Abd- soft, nontender, neg;  Ext- neg w/o c/c/e...  CXR 02/24/15 showed mild cardiomegaly, clear lungs, NAD and unchanged from old films...   PFT 11/12/14 showed FVC=2.54 (114), FEV1=1.97 (114), %1sec=77, mid-flows are wnl at 101% predicted; TLC & RV were mildly reduced and DLCO was wnl...   LABS 4-8/16 showed:  Chems- wnl x Ca=11.1 & needs f/u repeat by her PCP;   CBC- wnl, eos=2%;  A1c=5.9.Marland Kitchen.Marland Kitchen.      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 w/ her main triggers usually upper resp infections;  This pattern has been repeated over the yrs starting in ?2014 & she usually responds to antibiotics, Pred, inhaled bronchodil & ICS;  The pattern over the last several months is c/w this diagnosis & she finally improved w/ the Pred;  I believe that she would benefit from an ICS/LABA combination inhaler to use regularly & hopefully prevent the freq recurrences that tended to occur when she runs out of meds;  She stopped prev Flovent & never filled the Qvar due to cost ($200);  We checked in Epic & her insurance should cover SYMBICORT160- 2spBid;  She has VENTOLIN rescue inhaler to use 1-2sp Q6H prn;  Her other issue appears to be GERD/ reflux w/ nocturnal & early AM symptoms- we discussed a vigorous antireflux regimen w/ Protonix40, NPO after dinner, elev HOB 6" (see AVS);  She will call w/ questions or problems and we plan recheck in 6 weeks...  ~  April 18, 2015:  6wk ROV w/ SN>  Theresa Terry returns on her Symbicort- 2spBid, Ventolin rescue inhaler (rarely uses), and her vigorous antireflux regimen (Protonix40 before dinner, NPO after dinner, elev HOB 6")- much improved, resting better, no nighttime cough & improved days; notes  min cough (some drainage), no sput, no hemoptysis, denies dyspnea but she is still too sedentary & we discussed the importance of exercise; NOTE> she refuses vaccines...     AR> on Claritin & Flonase doing satis...    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    GERD & prob LPR> improved on Protonix40Qhs, NPO after dinner, elev HOB 6"...    Medical issues>  HBP on Maxzide-25, DJD- followed by DrCrawford...  EXAM reveals Afeb, VSS, O2sat=98% on RA;  HEENT- neg, mallampati1;  Chest- clear w/o w/r/r, no congestion, cough is  dry;  Heart- RR w/o m/r/g;  Abd- soft, nontender, neg;  Ext- neg w/o c/c/e... IMP/PLAN>>  Jacque is improved on her inhalers regularly & by following a vigorous antireflux regimen for her GERD/LPR; continue same rx...    Past Medical History  Diagnosis Date  . Hypertension >> on Maxzide-25   . Asthma >> SEE ABOVE   . Arthritis   . GERD (gastroesophageal reflux disease) >> adding Protonix40 & antireflux regimen   . Allergy   . Cataract     Past Surgical History  Procedure Laterality Date  . Abdominal hysterectomy    . Tonsillectomy and adenoidectomy  1961    Outpatient Encounter Prescriptions as of 04/18/2015  Medication Sig  . albuterol (PROVENTIL HFA;VENTOLIN HFA) 108 (90 BASE) MCG/ACT inhaler Inhale 1-2 puffs into the lungs every 6 (six) hours as needed for wheezing.  . budesonide-formoterol (SYMBICORT) 160-4.5 MCG/ACT inhaler Inhale 2 puffs into the lungs 2 (two) times daily.  . fluticasone (FLONASE) 50 MCG/ACT nasal spray Place 2 sprays into both nostrils daily.  Marland Kitchen loratadine (CLARITIN) 10 MG tablet Take 10 mg by mouth daily.  . pantoprazole (PROTONIX) 40 MG tablet Take 1 tablet (40 mg total) by mouth daily. Take 30 minutes before dinner  . triamterene-hydrochlorothiazide (MAXZIDE-25) 37.5-25 MG per tablet Take 1 tablet by mouth daily.   No facility-administered encounter medications on file as of 04/18/2015.    No Known Allergies    There is no immunization history on file for this patient. She needs to be sure that she gets all her needed vaccines & will check w/ her PCP.   Current Medications, Allergies, Past Medical History, Past Surgical History, Family History, and Social History were reviewed in [employer] record.   Review of Systems            All symptoms NEG except where BOLDED >>  Constitutional:  F/C/S, fatigue, anorexia, unexpected weight change. HEENT:  HA, visual changes, hearing loss, earache, nasal symptoms, sore throat, mouth  sores, hoarseness. Resp:  cough, sputum, hemoptysis; SOB, tightness, wheezing. Cardio:  CP, palpit, DOE, orthopnea, edema. GI:  N/V/D/C, blood in stool; reflux, abd pain, distention, gas. GU:  dysuria, freq, urgency, hematuria, flank pain, voiding difficulty. MS:  joint pain, swelling, tenderness, decr ROM; neck pain, back pain, etc. Neuro:  HA, tremors, seizures, dizziness, syncope, weakness, numbness, gait abn. Skin:  suspicious lesions or skin rash. Heme:  adenopathy, bruising, bleeding. Psyche:  confusion, agitation, sleep disturbance, hallucinations, anxiety, depression suicidal.   Objective:   Physical Exam      Vital Signs:  Reviewed...   General:  WD, WN, 64 y/o BF in NAD; alert & oriented; pleasant & cooperative... HEENT:  [place]/AT; Conjunctiva- pink, Sclera- nonicteric, EOM-wnl, PERRLA, EACs-clear, TMs-wnl; NOSE-clear; THROAT-clear & wnl.  Neck:  Supple w/ fairROM;  no JVD; normal carotid impulses w/o bruits; no thyromegaly or nodules palpated; no lymphadenopathy.  Chest:  Clear to P & A; without wheezes, rales, or rhonchi heard. Heart:  Regular Rhythm; norm S1 & S2 without murmurs, rubs, or gallops detected. Abdomen:  Soft & nontender- no guarding or rebound; normal bowel sounds; no organomegaly or masses palpated. Ext:  DecrROM; without deformities +arthritic changes; no varicose veins, venous insuffic, or edema;  Pulses intact w/o bruits. Neuro:  No focal neuro deficits; sensory testing normal; gait normal & balance OK. Derm:  No lesions noted; no rash etc. Lymph:  No cervical, supraclavicular, axillary, or inguinal adenopathy palpated.   Assessment:      IMP >>     RADS -- we discussed the pathophysiology & rec SYMBICORT160-2spBid regularly + VentolinHFA rescue prn...    GERD w/ prob LPR -- we discussed an antireflux regimen w/ Protonix/ NPO after dinner/ elev HOB 6"...  PLAN >>  03/07/15>   Tomika appears to have AB/ RADS w/ her main triggers usually upper resp infections;   This pattern has been repeated over the yrs starting in ?2014 & she usually responds to antibiotics, Pred, inhaled bronchodil & ICS;  The pattern over the last several months is c/w this diagnosis & she finally improved w/ the Pred;  I believe that she would benefit from an ICS/LABA combination inhaler to use regularly & hopefully prevent the freq recurrences that tended to occur when she runs out of meds;  She stopped prev Flovent & never filled the Qvar due to cost ($200);  We checked in Epic & her insurance should cover SYMBICORT160- 2spBid;  She has VENTOLIN rescue inhaler to use 1-2sp Q6H prn;  Her other issue appears to be GERD/ reflux w/ nocturnal & early AM symptoms- we discussed a vigorous antireflux regimen w/ Protonix40, NPO after dinner, elev HOB 6" (see AVS);  She will call w/ questions or problems and we plan recheck in 6 weeks.  04/18/15>     Lara is improved on her inhalers regularly & by following a vigorous antireflux regimen for her GERD/LPR; continue same rx   Plan:     Patient's Medications  New Prescriptions   No medications on file  Previous Medications   ALBUTEROL (PROVENTIL HFA;VENTOLIN HFA) 108 (90 BASE) MCG/ACT INHALER    Inhale 1-2 puffs into the lungs every 6 (six) hours as needed for wheezing.   BUDESONIDE-FORMOTEROL (SYMBICORT) 160-4.5 MCG/ACT INHALER    Inhale 2 puffs into the lungs 2 (two) times daily.   FLUTICASONE (FLONASE) 50 MCG/ACT NASAL SPRAY    Place 2 sprays into both nostrils daily.   LORATADINE (CLARITIN) 10 MG TABLET    Take 10 mg by mouth daily.   PANTOPRAZOLE (PROTONIX) 40 MG TABLET    Take 1 tablet (40 mg total) by mouth daily. Take 30 minutes before dinner   TRIAMTERENE-HYDROCHLOROTHIAZIDE (MAXZIDE-25) 37.5-25 MG PER TABLET    Take 1 tablet by mouth daily.  Modified Medications   No medications on file  Discontinued Medications   No medications on file

## 2015-04-18 NOTE — Patient Instructions (Signed)
Today we updated your med list in our EPIC system...    Continue your current medications the same...  We are pleased that you have done so well on the [facility] LPYMBICORT- continue the same...  Gradually increase your exercise program to build stamina...  Call for any questions...  Let's plan a follow up visit in 50mo, sooner if needed for problems.Marland Kitchen..Marland Kitchen

## 2015-05-07 ENCOUNTER — Other Ambulatory Visit: Payer: Self-pay | Admitting: Geriatric Medicine

## 2015-05-07 ENCOUNTER — Telehealth: Payer: Self-pay | Admitting: Internal Medicine

## 2015-05-07 MED ORDER — TRIAMTERENE-HCTZ 37.5-25 MG PO TABS: 1.0000 | ORAL_TABLET | Freq: Every day | ORAL | Status: DC

## 2015-05-07 NOTE — Telephone Encounter (Signed)
Sent to pharmacy 

## 2015-05-07 NOTE — Telephone Encounter (Signed)
Patient requesting refill for triamterene-hydrochlorothiazide (MAXZIDE-25) 37.5-25 MG per tablet [[id number]][[id number]]  [address] is [facility] [address]

## 2015-08-05 DIAGNOSIS — H40003 Preglaucoma, unspecified, bilateral: Secondary | ICD-10-CM | POA: Diagnosis not present

## 2015-08-05 DIAGNOSIS — H25013 Cortical age-related cataract, bilateral: Secondary | ICD-10-CM | POA: Diagnosis not present

## 2015-08-05 DIAGNOSIS — H2513 Age-related nuclear cataract, bilateral: Secondary | ICD-10-CM | POA: Diagnosis not present

## 2015-08-05 DIAGNOSIS — H25042 Posterior subcapsular polar age-related cataract, left eye: Secondary | ICD-10-CM | POA: Diagnosis not present

## 2015-08-12 DIAGNOSIS — H2512 Age-related nuclear cataract, left eye: Secondary | ICD-10-CM | POA: Diagnosis not present

## 2015-08-22 DIAGNOSIS — H2512 Age-related nuclear cataract, left eye: Secondary | ICD-10-CM | POA: Diagnosis not present

## 2015-08-22 DIAGNOSIS — H25812 Combined forms of age-related cataract, left eye: Secondary | ICD-10-CM | POA: Diagnosis not present

## 2015-08-23 DIAGNOSIS — H2511 Age-related nuclear cataract, right eye: Secondary | ICD-10-CM | POA: Diagnosis not present

## 2015-09-12 DIAGNOSIS — H2511 Age-related nuclear cataract, right eye: Secondary | ICD-10-CM | POA: Diagnosis not present

## 2015-09-12 DIAGNOSIS — H25811 Combined forms of age-related cataract, right eye: Secondary | ICD-10-CM | POA: Diagnosis not present

## 2015-10-17 ENCOUNTER — Ambulatory Visit (INDEPENDENT_AMBULATORY_CARE_PROVIDER_SITE_OTHER): Payer: Medicare Other | Admitting: Pulmonary Disease

## 2015-10-17 ENCOUNTER — Encounter: Payer: Self-pay | Admitting: Pulmonary Disease

## 2015-10-17 VITALS — BP 112/76 | HR 82 | Temp 97.5°F | Ht 61.0 in | Wt 167.4 lb

## 2015-10-17 DIAGNOSIS — J387 Other diseases of larynx: Secondary | ICD-10-CM

## 2015-10-17 DIAGNOSIS — J4520 Mild intermittent asthma, uncomplicated: Secondary | ICD-10-CM

## 2015-10-17 DIAGNOSIS — I10 Essential (primary) hypertension: Secondary | ICD-10-CM | POA: Diagnosis not present

## 2015-10-17 DIAGNOSIS — K219 Gastro-esophageal reflux disease without esophagitis: Secondary | ICD-10-CM

## 2015-10-17 DIAGNOSIS — J683 Other acute and subacute respiratory conditions due to chemicals, gases, fumes and vapors: Secondary | ICD-10-CM

## 2015-10-17 DIAGNOSIS — F411 Generalized anxiety disorder: Secondary | ICD-10-CM

## 2015-10-17 NOTE — Patient Instructions (Signed)
Today we updated your med list in our EPIC system...    Continue your current medications the same...  The Symbicort has worked very well for you...    Remember to take it regularly for the reactive airways disease- 2 sprays twice daily...    And use the VentolinHFA rescue inhaler as needed...  It is time for your FASTING blood work w/ your PCP DrCrawford- we will help you get this set up in the near future...  Call for any questions or if we can be of service in any way.Marland Kitchen..Marland Kitchen

## 2015-11-27 ENCOUNTER — Ambulatory Visit: Payer: Medicare Other | Admitting: Internal Medicine

## 2015-12-02 ENCOUNTER — Encounter: Payer: Self-pay | Admitting: Internal Medicine

## 2015-12-02 ENCOUNTER — Other Ambulatory Visit (INDEPENDENT_AMBULATORY_CARE_PROVIDER_SITE_OTHER): Payer: Medicare Other

## 2015-12-02 ENCOUNTER — Ambulatory Visit (INDEPENDENT_AMBULATORY_CARE_PROVIDER_SITE_OTHER): Payer: Medicare Other | Admitting: Internal Medicine

## 2015-12-02 VITALS — BP 162/80 | HR 79 | Temp 98.5°F | Resp 14 | Ht 61.0 in | Wt 168.0 lb

## 2015-12-02 DIAGNOSIS — E669 Obesity, unspecified: Secondary | ICD-10-CM

## 2015-12-02 DIAGNOSIS — Z0000 Encounter for general adult medical examination without abnormal findings: Secondary | ICD-10-CM

## 2015-12-02 DIAGNOSIS — R7301 Impaired fasting glucose: Secondary | ICD-10-CM | POA: Diagnosis not present

## 2015-12-02 DIAGNOSIS — I10 Essential (primary) hypertension: Secondary | ICD-10-CM

## 2015-12-02 DIAGNOSIS — Z1159 Encounter for screening for other viral diseases: Secondary | ICD-10-CM | POA: Diagnosis not present

## 2015-12-02 DIAGNOSIS — E785 Hyperlipidemia, unspecified: Secondary | ICD-10-CM

## 2015-12-02 DIAGNOSIS — R058 Other specified cough: Secondary | ICD-10-CM

## 2015-12-02 DIAGNOSIS — R05 Cough: Secondary | ICD-10-CM | POA: Diagnosis not present

## 2015-12-02 LAB — LIPID PANEL
CHOL/HDL RATIO: 3
Cholesterol: 211 mg/dL — ABNORMAL HIGH (ref 0–200)
HDL: 73.7 mg/dL (ref >39.00)
LDL CALC: 106 mg/dL — AB (ref 0–99)
NONHDL: 137.13
TRIGLYCERIDES: 154 mg/dL — AB (ref 0.0–149.0)
VLDL: 30.8 mg/dL (ref 0.0–40.0)

## 2015-12-02 LAB — COMPREHENSIVE METABOLIC PANEL
ALT: 17 U/L (ref 0–35)
AST: 17 U/L (ref 0–37)
Albumin: 4.6 g/dL (ref 3.5–5.2)
Alkaline Phosphatase: 84 U/L (ref 39–117)
BILIRUBIN TOTAL: 0.5 mg/dL (ref 0.2–1.2)
BUN: 14 mg/dL (ref 6–23)
CALCIUM: 11.2 mg/dL — AB (ref 8.4–10.5)
CHLORIDE: 103 meq/L (ref 96–112)
CO2: 32 meq/L (ref 19–32)
CREATININE: 0.95 mg/dL (ref 0.40–1.20)
GFR: 75.46 mL/min (ref >60.00)
GLUCOSE: 105 mg/dL — AB (ref 70–99)
Potassium: 4.5 mEq/L (ref 3.5–5.1)
SODIUM: 139 meq/L (ref 135–145)
Total Protein: 7.6 g/dL (ref 6.0–8.3)

## 2015-12-02 LAB — HEMOGLOBIN A1C: HEMOGLOBIN A1C: 5.8 % (ref 4.6–6.5)

## 2015-12-02 LAB — HEPATITIS C ANTIBODY: HCV Ab: REACTIVE — AB

## 2015-12-02 MED ORDER — ESOMEPRAZOLE MAGNESIUM 20 MG PO CPDR: 20.0000 mg | DELAYED_RELEASE_CAPSULE | Freq: Every day | ORAL | Status: DC

## 2015-12-02 MED ORDER — FLUTICASONE PROPIONATE 50 MCG/ACT NA SUSP: 2.0000 | Freq: Every day | NASAL | Status: DC

## 2015-12-02 NOTE — Progress Notes (Signed)
**Note Theresa-Identified via Obfuscation**    Subjective:    Patient ID: Theresa Terry, female    DOB: 09-11-1950, 65 y.o.   MRN: [id number]  HPI Here for medicare wellness, no new complaints. Please see A/P for status and treatment of chronic medical problems. Wants rx for nexium, has tried protonix, omeprazole in the past which were not effective.   Diet: heart healthy  Physical activity: sedentary Depression/mood screen: negative Hearing: intact to whispered voice Visual acuity: grossly normal, performs annual eye exam  ADLs: capable Fall risk: none Home safety: good Cognitive evaluation: intact to orientation, naming, recall and repetition EOL planning: adv directives discussed  I have personally reviewed and have noted 1. The patient's medical and social history - reviewed today no changes 2. Their use of alcohol, tobacco or illicit drugs 3. Their current medications and supplements 4. The patient's functional ability including ADL's, fall risks, home safety risks and hearing or visual impairment. 5. Diet and physical activities 6. Evidence for depression or mood disorders 7. Care team reviewed and updated (available in snapshot)  Review of Systems  Constitutional: Negative for fever, activity change, appetite change, fatigue and unexpected weight change.  HENT: Positive for postnasal drip and rhinorrhea. Negative for congestion, dental problem, ear discharge, ear pain, sinus pressure and trouble swallowing.   Eyes: Negative.   Respiratory: Positive for cough. Negative for chest tightness, shortness of breath and wheezing.   Cardiovascular: Negative for chest pain, palpitations and leg swelling.  Gastrointestinal: Negative for nausea, abdominal pain, diarrhea, constipation and abdominal distention.  Musculoskeletal: Negative.   Skin: Negative.   Neurological: Negative.  Negative for weakness and headaches.  Psychiatric/Behavioral: Negative for suicidal ideas, self-injury and decreased concentration. The patient is not  nervous/anxious.       Objective:   Physical Exam  Constitutional: She is oriented to person, place, and time. She appears well-developed and well-nourished.  Overweight  HENT:  Head: Normocephalic and atraumatic.  Eyes: EOM are normal.  Neck: Normal range of motion.  Cardiovascular: Normal rate and regular rhythm.   Pulmonary/Chest: Effort normal and breath sounds normal. No respiratory distress. She has no wheezes. She has no rales. She exhibits no tenderness.  Abdominal: Soft. Bowel sounds are normal. She exhibits no distension. There is no tenderness. There is no rebound.  Musculoskeletal: She exhibits no edema.  Neurological: She is alert and oriented to person, place, and time.  Skin: Skin is warm and dry.  Psychiatric:  Slightly anxious during our visit   Filed Vitals:   12/02/15 1429 12/02/15 1453  BP: 158/94 162/80  Pulse: 79   Temp: 98.5 F (36.9 C)   TempSrc: Oral   Resp: 14   Height: 5\' 1"  (1.549 m)   Weight: 168 lb (76.204 kg)   SpO2: 98%       Assessment & Plan:

## 2015-12-02 NOTE — Assessment & Plan Note (Signed)
Declines colon cancer screening today and has never had. Declines any shots as well today so immunizations not up to date. Dexa up to date and doing hep c screening today. Checking labs. Counseled on the need for colon cancer screening and routine exercise for health. Given 10 year screening recommendations.

## 2015-12-02 NOTE — Assessment & Plan Note (Signed)
Weight is stable and she is working on continuing to decrease weight.

## 2015-12-02 NOTE — Assessment & Plan Note (Signed)
Generally at goal and she does not know why it is high today. Will see her back sooner and adjust as needed. Continue hctz.

## 2015-12-02 NOTE — Patient Instructions (Signed)
We will check the blood work today and call you back with the results.   We have sent in the nexium so try to fill it at the pharmacy and we can work on the prior authorization.   Keep up the good work on the exercise.   Health Maintenance, Female Adopting a healthy lifestyle and getting preventive care can go a long way to promote health and wellness. Talk with your health care provider about what schedule of regular examinations is right for you. This is a good chance for you to check in with your provider about disease prevention and staying healthy. In between checkups, there are plenty of things you can do on your own. Experts have done a lot of research about which lifestyle changes and preventive measures are most likely to keep you healthy. Ask your health care provider for more information. WEIGHT AND DIET  Eat a healthy diet  Be sure to include plenty of vegetables, fruits, low-fat dairy products, and lean protein.  Do not eat a lot of foods high in solid fats, added sugars, or salt.  Get regular exercise. This is one of the most important things you can do for your health.  Most adults should exercise for at least 150 minutes each week. The exercise should increase your heart rate and make you sweat (moderate-intensity exercise).  Most adults should also do strengthening exercises at least twice a week. This is in addition to the moderate-intensity exercise.  Maintain a healthy weight  Body mass index (BMI) is a measurement that can be used to identify possible weight problems. It estimates body fat based on height and weight. Your health care provider can help determine your BMI and help you achieve or maintain a healthy weight.  For females 90 years of age and older:   A BMI below 18.5 is considered underweight.  A BMI of 18.5 to 24.9 is normal.  A BMI of 25 to 29.9 is considered overweight.  A BMI of 30 and above is considered obese.  Watch levels of cholesterol and  blood lipids  You should start having your blood tested for lipids and cholesterol at 67 years of age, then have this test every 5 years.  You may need to have your cholesterol levels checked more often if:  Your lipid or cholesterol levels are high.  You are older than 67 years of age.  You are at high risk for heart disease.  CANCER SCREENING   Lung Cancer  Lung cancer screening is recommended for adults 69-68 years old who are at high risk for lung cancer because of a history of smoking.  A yearly low-dose CT scan of the lungs is recommended for people who:  Currently smoke.  Have quit within the past 15 years.  Have at least a 30-pack-year history of smoking. A pack year is smoking an average of one pack of cigarettes a day for 1 year.  Yearly screening should continue until it has been 15 years since you quit.  Yearly screening should stop if you develop a health problem that would prevent you from having lung cancer treatment.  Breast Cancer  Practice breast self-awareness. This means understanding how your breasts normally appear and feel.  It also means doing regular breast self-exams. Let your health care provider know about any changes, no matter how small.  If you are in your 20s or 30s, you should have a clinical breast exam (CBE) by a health care provider every 1-3 years  as part of a regular health exam.  If you are 40 or older, have a CBE every year. Also consider having a breast X-ray (mammogram) every year.  If you have a family history of breast cancer, talk to your health care provider about genetic screening.  If you are at high risk for breast cancer, talk to your health care provider about having an MRI and a mammogram every year.  Breast cancer gene (BRCA) assessment is recommended for women who have family members with BRCA-related cancers. BRCA-related cancers include:  Breast.  Ovarian.  Tubal.  Peritoneal cancers.  Results of the  assessment will determine the need for genetic counseling and BRCA1 and BRCA2 testing. Cervical Cancer Your health care provider may recommend that you be screened regularly for cancer of the pelvic organs (ovaries, uterus, and vagina). This screening involves a pelvic examination, including checking for microscopic changes to the surface of your cervix (Pap test). You may be encouraged to have this screening done every 3 years, beginning at age 63.  For women ages 75-65, health care providers may recommend pelvic exams and Pap testing every 3 years, or they may recommend the Pap and pelvic exam, combined with testing for human papilloma virus (HPV), every 5 years. Some types of HPV increase your risk of cervical cancer. Testing for HPV may also be done on women of any age with unclear Pap test results.  Other health care providers may not recommend any screening for nonpregnant women who are considered low risk for pelvic cancer and who do not have symptoms. Ask your health care provider if a screening pelvic exam is right for you.  If you have had past treatment for cervical cancer or a condition that could lead to cancer, you need Pap tests and screening for cancer for at least 20 years after your treatment. If Pap tests have been discontinued, your risk factors (such as having a new sexual partner) need to be reassessed to determine if screening should resume. Some women have medical problems that increase the chance of getting cervical cancer. In these cases, your health care provider may recommend more frequent screening and Pap tests. Colorectal Cancer  This type of cancer can be detected and often prevented.  Routine colorectal cancer screening usually begins at 67 years of age and continues through 66 years of age.  Your health care provider may recommend screening at an earlier age if you have risk factors for colon cancer.  Your health care provider may also recommend using home test kits  to check for hidden blood in the stool.  A small camera at the end of a tube can be used to examine your colon directly (sigmoidoscopy or colonoscopy). This is done to check for the earliest forms of colorectal cancer.  Routine screening usually begins at age 51.  Direct examination of the colon should be repeated every 5-10 years through 67 years of age. However, you may need to be screened more often if early forms of precancerous polyps or small growths are found. Skin Cancer  Check your skin from head to toe regularly.  Tell your health care provider about any new moles or changes in moles, especially if there is a change in a mole's shape or color.  Also tell your health care provider if you have a mole that is larger than the size of a pencil eraser.  Always use sunscreen. Apply sunscreen liberally and repeatedly throughout the day.  Protect yourself by wearing long sleeves,  pants, a wide-brimmed hat, and sunglasses whenever you are outside. HEART DISEASE, DIABETES, AND HIGH BLOOD PRESSURE   High blood pressure causes heart disease and increases the risk of stroke. High blood pressure is more likely to develop in:  People who have blood pressure in the high end of the normal range (130-139/85-89 mm Hg).  People who are overweight or obese.  People who are African American.  If you are 76-54 years of age, have your blood pressure checked every 3-5 years. If you are 54 years of age or older, have your blood pressure checked every year. You should have your blood pressure measured twice--once when you are at a hospital or clinic, and once when you are not at a hospital or clinic. Record the average of the two measurements. To check your blood pressure when you are not at a hospital or clinic, you can use:  An automated blood pressure machine at a pharmacy.  A home blood pressure monitor.  If you are between 70 years and 94 years old, ask your health care provider if you should  take aspirin to prevent strokes.  Have regular diabetes screenings. This involves taking a blood sample to check your fasting blood sugar level.  If you are at a normal weight and have a low risk for diabetes, have this test once every three years after 67 years of age.  If you are overweight and have a high risk for diabetes, consider being tested at a younger age or more often. PREVENTING INFECTION  Hepatitis B  If you have a higher risk for hepatitis B, you should be screened for this virus. You are considered at high risk for hepatitis B if:  You were born in a country where hepatitis B is common. Ask your health care provider which countries are considered high risk.  Your parents were born in a high-risk country, and you have not been immunized against hepatitis B (hepatitis B vaccine).  You have HIV or AIDS.  You use needles to inject street drugs.  You live with someone who has hepatitis B.  You have had sex with someone who has hepatitis B.  You get hemodialysis treatment.  You take certain medicines for conditions, including cancer, organ transplantation, and autoimmune conditions. Hepatitis C  Blood testing is recommended for:  Everyone born from [birth year] through 1967.  Anyone with known risk factors for hepatitis C. Sexually transmitted infections (STIs)  You should be screened for sexually transmitted infections (STIs) including gonorrhea and chlamydia if:  You are sexually active and are younger than 67 years of age.  You are older than 67 years of age and your health care provider tells you that you are at risk for this type of infection.  Your sexual activity has changed since you were last screened and you are at an increased risk for chlamydia or gonorrhea. Ask your health care provider if you are at risk.  If you do not have HIV, but are at risk, it may be recommended that you take a prescription medicine daily to prevent HIV infection. This is called  pre-exposure prophylaxis (PrEP). You are considered at risk if:  You are sexually active and do not regularly use condoms or know the HIV status of your partner(s).  You take drugs by injection.  You are sexually active with a partner who has HIV. Talk with your health care provider about whether you are at high risk of being infected with HIV. If you choose to  begin PrEP, you should first be tested for HIV. You should then be tested every 3 months for as long as you are taking PrEP.  PREGNANCY   If you are premenopausal and you may become pregnant, ask your health care provider about preconception counseling.  If you may become pregnant, take 400 to 800 micrograms (mcg) of folic acid every day.  If you want to prevent pregnancy, talk to your health care provider about birth control (contraception). OSTEOPOROSIS AND MENOPAUSE   Osteoporosis is a disease in which the bones lose minerals and strength with aging. This can result in serious bone fractures. Your risk for osteoporosis can be identified using a bone density scan.  If you are 73 years of age or older, or if you are at risk for osteoporosis and fractures, ask your health care provider if you should be screened.  Ask your health care provider whether you should take a calcium or vitamin D supplement to lower your risk for osteoporosis.  Menopause may have certain physical symptoms and risks.  Hormone replacement therapy may reduce some of these symptoms and risks. Talk to your health care provider about whether hormone replacement therapy is right for you.  HOME CARE INSTRUCTIONS   Schedule regular health, dental, and eye exams.  Stay current with your immunizations.   Do not use any tobacco products including cigarettes, chewing tobacco, or electronic cigarettes.  If you are pregnant, do not drink alcohol.  If you are breastfeeding, limit how much and how often you drink alcohol.  Limit alcohol intake to no more than 1  drink per day for nonpregnant women. One drink equals 12 ounces of beer, 5 ounces of wine, or 1 ounces of hard liquor.  Do not use street drugs.  Do not share needles.  Ask your health care provider for help if you need support or information about quitting drugs.  Tell your health care provider if you often feel depressed.  Tell your health care provider if you have ever been abused or do not feel safe at home.   This information is not intended to replace advice given to you by your health care provider. Make sure you discuss any questions you have with your health care provider.   Document Released: 12/29/2010 Document Revised: 07/06/2014 Document Reviewed: 05/17/2013 Elsevier Interactive Patient Education [employer].

## 2015-12-02 NOTE — Assessment & Plan Note (Signed)
Doing well with symbicort and rare albuterol. Takes flonase.

## 2015-12-02 NOTE — Progress Notes (Signed)
Pre visit review using our clinic review tool, if applicable. No additional management support is needed unless otherwise documented below in the visit note. 

## 2015-12-03 LAB — HEPATITIS C RNA QUANTITATIVE: HCV QUANT: NOT DETECTED [IU]/mL (ref <15)

## 2015-12-10 ENCOUNTER — Telehealth: Payer: Self-pay

## 2015-12-10 NOTE — Telephone Encounter (Signed)
[id number]

## 2015-12-11 NOTE — Telephone Encounter (Signed)
PA DENIED, covered alternative is Dexilant. Please advise, thanks!

## 2015-12-12 MED ORDER — DEXLANSOPRAZOLE 30 MG PO CPDR: 30.0000 mg | DELAYED_RELEASE_CAPSULE | Freq: Every day | ORAL | Status: DC

## 2015-12-12 NOTE — Telephone Encounter (Signed)
Ok, sent in Elfridadexilant. Call patient and let her know.

## 2015-12-12 NOTE — Telephone Encounter (Signed)
Pt advised.

## 2016-01-16 ENCOUNTER — Other Ambulatory Visit: Payer: Self-pay | Admitting: Internal Medicine

## 2016-03-23 ENCOUNTER — Telehealth: Payer: Self-pay | Admitting: Pulmonary Disease

## 2016-03-23 ENCOUNTER — Other Ambulatory Visit: Payer: Self-pay | Admitting: Pulmonary Disease

## 2016-03-23 ENCOUNTER — Telehealth: Payer: Self-pay

## 2016-03-23 MED ORDER — TRIAMTERENE-HCTZ 37.5-25 MG PO TABS: 1.0000 | ORAL_TABLET | Freq: Every day | ORAL | 2 refills | Status: DC

## 2016-03-23 MED ORDER — BUDESONIDE-FORMOTEROL FUMARATE 160-4.5 MCG/ACT IN AERO: 2.0000 | INHALATION_SPRAY | Freq: Two times a day (BID) | RESPIRATORY_TRACT | 12 refills | Status: DC

## 2016-03-23 NOTE — Telephone Encounter (Signed)
Last Rx written in 12/2015, likely pt needs prior approval and not prior authorization. Please advise

## 2016-03-23 NOTE — Telephone Encounter (Signed)
Called and spoke with pt and he is aware of refill that has been sent to the pharmacy.  Nothing further is needed.  

## 2016-03-23 NOTE — Telephone Encounter (Signed)
triamterene-hydrochlorothiazide (MAXZIDE-25) 37.5-25 MG tablet   Patient called in waiting a refill on this medication. States it needs a PA. Would also like for it to be a 3 month supply. Please follow up with patient. Thank you.

## 2016-03-23 NOTE — Telephone Encounter (Signed)
Notified pt rx sent to [facility].../lmb 

## 2016-03-26 ENCOUNTER — Other Ambulatory Visit: Payer: Self-pay | Admitting: *Deleted

## 2016-03-26 MED ORDER — TRIAMTERENE-HCTZ 37.5-25 MG PO TABS: 1.0000 | ORAL_TABLET | Freq: Every day | ORAL | 2 refills | Status: DC

## 2016-04-13 NOTE — Progress Notes (Signed)
**Note Theresa-Identified via Obfuscation** Subjective:     Patient ID: Theresa Terry, female   DOB: March 10, 1951, 65 y.o.   MRN: [id number]  HPI  ~  March 07, 2015:  Initial pulmonary consult w/ SN>  65 y/o BF, referred by DrDaub for a pulmonary evaluation due to recurrent asthmatic bronchitis symptoms>       Theresa Terry is a never smoker w/ hx of recurrent upper resp infections that lead to persistent cough, chest congestion, wheezing, and dyspnea;  She notes that the infections are "hard to shake" and the symptoms "come right back when I finish the meds";  She has had OVs monthly since [id number] betw DrKollar, [facility], & DrHopper- over this time frame she has been treated w/ ZPak, Pred, Flovent then Qvar, Ventolin rescue, Claritin, Flonase, Tessalon;  She had similar problem in 2014 w/ several ER visits & she notes that all the meds seem to help but the symptoms come back when she stops them... The main trigger seems to be upper resp infections, BUT she also c/o cough & wheezing at nights & AM cough as well- she checked this out on-line & wonders if it could relate to her prob...       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  FamHx is neg for lung problems...      EXAM reveals Afeb, VSS, O2sat=98% on RA;  HEENT- neg, mallampati1;  Chest- clear w/o w/r/r, no congestion, cough is dry;  Heart- RR w/o m/r/g;  Abd- soft, nontender, neg;  Ext- neg w/o c/c/e...  CXR 02/24/15 showed mild cardiomegaly, clear lungs, NAD and unchanged from old films...   PFT 11/12/14 showed FVC=2.54 (114), FEV1=1.97 (114), %1sec=77, mid-flows are wnl at 101% predicted; TLC & RV were mildly reduced and DLCO was wnl...   LABS 4-8/16 showed:  Chems- wnl x Ca=11.1 & needs f/u repeat by her PCP;   CBC- wnl, eos=2%;  A1c=5.9.Marland Kitchen.Marland Kitchen.      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 w/ her main triggers usually upper resp infections;  This pattern has been repeated over the yrs starting in ?2014 & she usually responds to antibiotics, Pred, inhaled bronchodil & ICS;  The pattern over the last several months is c/w this diagnosis & she finally improved w/ the Pred;  I believe that she would benefit from an ICS/LABA combination inhaler to use regularly & hopefully prevent the freq recurrences that tended to occur when she runs out of meds;  She stopped prev Flovent & never filled the Qvar due to cost ($200);  We checked in Epic & her insurance should cover SYMBICORT160- 2spBid;  She has VENTOLIN rescue inhaler to use 1-2sp Q6H prn;  Her other issue appears to be GERD/ reflux w/ nocturnal & early AM symptoms- we discussed a vigorous antireflux regimen w/ Protonix40, NPO after dinner, elev HOB 6" (see AVS);  She will call w/ questions or problems and we plan recheck in 6 weeks...  ~  April 18, 2015:  6wk ROV w/ SN>  Theresa Terry returns on her Symbicort- 2spBid, Ventolin rescue inhaler (rarely uses), and her vigorous antireflux regimen (Protonix40 before dinner, NPO after dinner, elev HOB 6")- much improved, resting better, no nighttime cough & improved days; notes  min cough (some drainage), no sput, no hemoptysis, denies dyspnea but she is still too sedentary & we discussed the importance of exercise; NOTE> she refuses vaccines...     AR> on Claritin & Flonase doing satis...    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    GERD & prob LPR> improved on Protonix40Qhs, NPO after dinner, elev HOB 6"...    Medical issues>  HBP on Maxzide-25, DJD, Anxiety- followed by DrCrawford...  EXAM reveals Afeb, VSS, O2sat=98% on RA;  HEENT- neg, mallampati1;  Chest- clear w/o w/r/r, no congestion,  cough is dry;  Heart- RR w/o m/r/g;  Abd- soft, nontender, neg;  Ext- neg w/o c/c/e... IMP/PLAN>>  Theresa Terry is improved on her inhalers regularly & by following a vigorous antireflux regimen for her GERD/LPR; continue same rx...  ~  October 17, 2015:  2mo ROV & Theresa Terry reports that she continues to do well, no recurrent AB episodes or refractory attacks; she tells me that she stopped the Symbicort160 on her own & now uses it just prn, and she hasn't needed the rescue inhaler at all; she does continue on the vigorous antireflux regimen (Protonix40 before dinner, NPO after dinner, elev HOB 6") & this seems to have made all the difference for her... we reviewed the following medical problems during today's office visit >>     AR> on Claritin & Flonase doing satis...    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    GERD & prob LPR> improved on Protonix40Qhs, NPO after dinner, elev HOB 6"...    Medical issues>  HBP on Maxzide-25, DJD, Anxiety, she refuses vaccines- followed by DrCrawford... NOTE: prev elev CALCIUM level at 11.1 Apr2016 needs recheck w/ PTH level to r/o primary hyperpara... EXAM reveals Afeb, VSS, O2sat=94% on RA;  HEENT- neg, mallampati1;  Chest- clear w/o w/r/r, no congestion, cough is dry;  Heart- RR w/o m/r/g;  Abd- soft, nontender, neg;  Ext- neg w/o c/c/e IMP/PLAN>>  Theresa Terry continues to do well from the pulm standpoint; she should take her Symbicort more regularly & definitely needs to incr her exercise program; continue antireflux regimen; we discussed the needed adult vaccinations (she continues to refuse vaccines); rec f/u w/ Pulm prn...    Past Medical History  Diagnosis Date  . Hypertension >> on Maxzide-25   . Asthma >> SEE ABOVE   . Arthritis   . GERD (gastroesophageal reflux disease) >> adding Protonix40 &  antireflux regimen   . Allergy   . Cataract     Past Surgical History:  Procedure Laterality Date  . ABDOMINAL HYSTERECTOMY    . TONSILLECTOMY AND ADENOIDECTOMY  1961    Outpatient Encounter Prescriptions as of 10/17/2015  Medication Sig  . albuterol (PROVENTIL HFA;VENTOLIN HFA) 108 (90 BASE) MCG/ACT inhaler Inhale 1-2 puffs into the lungs every 6 (six) hours as needed for wheezing.  Marland Kitchen loratadine (CLARITIN) 10 MG tablet Take 10 mg by mouth daily.  . [DISCONTINUED] budesonide-formoterol (SYMBICORT) 160-4.5 MCG/ACT inhaler Inhale 2 puffs into the lungs 2 (two) times daily.  . [DISCONTINUED] fluticasone (FLONASE) 50 MCG/ACT nasal spray Place 2 sprays into both nostrils daily.  . [DISCONTINUED] triamterene-hydrochlorothiazide (MAXZIDE-25) 37.5-25 MG tablet Take 1 tablet by mouth daily.  . [DISCONTINUED]  pantoprazole (PROTONIX) 40 MG tablet Take 1 tablet (40 mg total) by mouth daily. Take 30 minutes before dinner (Patient not taking: Reported on 10/17/2015)   No facility-administered encounter medications on file as of 10/17/2015.     No Known Allergies    There is no immunization history on file for this patient. She needs to be sure that she gets all her needed vaccines & will check w/ her PCP.   Current Medications, Allergies, Past Medical History, Past Surgical History, Family History, and Social History were reviewed in [employer] record.   Review of Systems            All symptoms NEG except where BOLDED >>  Constitutional:  F/C/S, fatigue, anorexia, unexpected weight change. HEENT:  HA, visual changes, hearing loss, earache, nasal symptoms, sore throat, mouth sores, hoarseness. Resp:  cough, sputum, hemoptysis; SOB, tightness, wheezing. Cardio:  CP, palpit, DOE, orthopnea, edema. GI:  N/V/D/C, blood in stool; reflux, abd pain, distention, gas. GU:  dysuria, freq, urgency, hematuria, flank pain, voiding difficulty. MS:  joint pain, swelling, tenderness,  decr ROM; neck pain, back pain, etc. Neuro:  HA, tremors, seizures, dizziness, syncope, weakness, numbness, gait abn. Skin:  suspicious lesions or skin rash. Heme:  adenopathy, bruising, bleeding. Psyche:  confusion, agitation, sleep disturbance, hallucinations, anxiety, depression suicidal.   Objective:   Physical Exam      Vital Signs:  Reviewed...   General:  WD, WN, 65 y/o BF in NAD; alert & oriented; pleasant & cooperative... HEENT:  [place]/AT; Conjunctiva- pink, Sclera- nonicteric, EOM-wnl, PERRLA, EACs-clear, TMs-wnl; NOSE-clear; THROAT-clear & wnl.  Neck:  Supple w/ fairROM; no JVD; normal carotid impulses w/o bruits; no thyromegaly or nodules palpated; no lymphadenopathy.  Chest:  Clear to P & A; without wheezes, rales, or rhonchi heard. Heart:  Regular Rhythm; norm S1 & S2 without murmurs, rubs, or gallops detected. Abdomen:  Soft & nontender- no guarding or rebound; normal bowel sounds; no organomegaly or masses palpated. Ext:  DecrROM; without deformities +arthritic changes; no varicose veins, venous insuffic, or edema;  Pulses intact w/o bruits. Neuro:  No focal neuro deficits; sensory testing normal; gait normal & balance OK. Derm:  No lesions noted; no rash etc. Lymph:  No cervical, supraclavicular, axillary, or inguinal adenopathy palpated.   Assessment:      IMP >>     AR> on Claritin & Flonase doing satis...    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    GERD & prob LPR> improved on Protonix40Qhs, NPO after dinner, elev HOB 6"...    Medical issues>  HBP on Maxzide-25, DJD, Anxiety, she refuses vaccines- followed by DrCrawford... NOTE: prev elev CALCIUM level at 11.1 Apr2016 needs recheck w/ PTH level to r/o primary hyperpara...  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 w/ her main  triggers usually upper resp infections;  This pattern has been repeated over the yrs starting in ?2014 & she usually responds to antibiotics, Pred, inhaled bronchodil & ICS;  The pattern over the last several months is c/w this diagnosis & she finally improved w/ the Pred;  I believe that she would benefit from an ICS/LABA combination inhaler to use regularly & hopefully prevent the freq recurrences that tended to occur when she runs out of meds;  She stopped prev  Flovent & never filled the Qvar due to cost ($200);  We checked in Epic & her insurance should cover SYMBICORT160- 2spBid;  She has VENTOLIN rescue inhaler to use 1-2sp Q6H prn;  Her other issue appears to be GERD/ reflux w/ nocturnal & early AM symptoms- we discussed a vigorous antireflux regimen w/ Protonix40, NPO after dinner, elev HOB 6" (see AVS);  She will call w/ questions or problems and we plan recheck in 6 weeks. 04/18/15>   Theresa Terry is improved on her inhalers regularly & by following a vigorous antireflux regimen for her GERD/LPR; continue same rx 10/17/15>   Theresa Terry continues to do well from the pulm standpoint; she should take her Symbicort more regularly & definitely needs to incr her exercise program; continue antireflux regimen; we discussed the needed adult vaccinations (she continues to refuse vaccines); rec f/u w/ Pulm prn   Plan:     Patient's Medications  New Prescriptions   DEXLANSOPRAZOLE (DEXILANT) 30 MG CAPSULE    Take 1 capsule (30 mg total) by mouth daily.   ESOMEPRAZOLE (NEXIUM) 20 MG CAPSULE    Take 1 capsule (20 mg total) by mouth daily at 12 noon.  Previous Medications   ALBUTEROL (PROVENTIL HFA;VENTOLIN HFA) 108 (90 BASE) MCG/ACT INHALER    Inhale 1-2 puffs into the lungs every 6 (six) hours as needed for wheezing.   LORATADINE (CLARITIN) 10 MG TABLET    Take 10 mg by mouth daily.  Modified Medications   Modified Medication Previous Medication   BUDESONIDE-FORMOTEROL (SYMBICORT) 160-4.5 MCG/ACT INHALER  budesonide-formoterol (SYMBICORT) 160-4.5 MCG/ACT inhaler      Inhale 2 puffs into the lungs 2 (two) times daily.    Inhale 2 puffs into the lungs 2 (two) times daily.   FLUTICASONE (FLONASE) 50 MCG/ACT NASAL SPRAY fluticasone (FLONASE) 50 MCG/ACT nasal spray      Place 2 sprays into both nostrils daily.    Place 2 sprays into both nostrils daily.   SYMBICORT 160-4.5 MCG/ACT INHALER budesonide-formoterol (SYMBICORT) 160-4.5 MCG/ACT inhaler      INHALE TWO PUFFS BY MOUTH TWICE DAILY    Inhale 2 puffs into the lungs 2 (two) times daily.   TRIAMTERENE-HYDROCHLOROTHIAZIDE (MAXZIDE-25) 37.5-25 MG TABLET triamterene-hydrochlorothiazide (MAXZIDE-25) 37.5-25 MG tablet      Take 1 tablet by mouth daily.    Take 1 tablet by mouth daily.  Discontinued Medications   PANTOPRAZOLE (PROTONIX) 40 MG TABLET    Take 1 tablet (40 mg total) by mouth daily. Take 30 minutes before dinner   TRIAMTERENE-HYDROCHLOROTHIAZIDE (MAXZIDE-25) 37.5-25 MG TABLET    Take 1 tablet by mouth daily.

## 2016-05-17 ENCOUNTER — Ambulatory Visit (HOSPITAL_COMMUNITY)
Admission: EM | Admit: 2016-05-17 | Discharge: 2016-05-17 | Disposition: A | Discharge status: Home / Self Care | Payer: Medicare (Managed Care) | Attending: Emergency Medicine | Admitting: Emergency Medicine

## 2016-05-17 ENCOUNTER — Encounter (HOSPITAL_COMMUNITY): Payer: Self-pay | Admitting: Emergency Medicine

## 2016-05-17 DIAGNOSIS — J069 Acute upper respiratory infection, unspecified: Secondary | ICD-10-CM | POA: Diagnosis not present

## 2016-05-17 DIAGNOSIS — B9789 Other viral agents as the cause of diseases classified elsewhere: Secondary | ICD-10-CM | POA: Diagnosis not present

## 2016-05-17 MED ORDER — HYDROCOD POLST-CPM POLST ER 10-8 MG/5ML PO SUER: 5.0000 mL | Freq: Two times a day (BID) | ORAL | 0 refills | Status: DC | PRN

## 2016-05-17 MED ORDER — PREDNISONE 10 MG PO TABS
ORAL_TABLET | ORAL | 0 refills | Status: DC
Start: 2016-05-17 — End: 2016-08-10

## 2016-05-17 NOTE — ED Triage Notes (Signed)
[facility] a [facility] cough and congestion x 1.5 weeks. The patient reported that she was coughing up a green phlegm.

## 2016-05-17 NOTE — ED Provider Notes (Signed)
**Note Theresa-Identified via Obfuscation** [facility]    CSN: [id number] Arrival date & time: 05/17/16  1623     History   Chief Complaint Chief Complaint  Patient presents with  . Cough    HPI Theresa Terry is a 65 y.o. female.   HPI She is a 65 year old woman here for evaluation of cough. She states for the last week and a half she has had cough and congestion. Cough is productive of yellow to green sputum. She has had some intermittent wheezing that is resolved with her inhaler. No shortness of breath. She reports some intermittent nasal congestion and runny nose. She does report some throat discomfort which she attributes to the cough. She also states her ears have felt itchy. No fevers.  Past Medical History:  Diagnosis Date  . Allergy   . Arthritis   . Asthma   . Cataract   . GERD (gastroesophageal reflux disease)   . Hypertension     Patient Active Problem List   Diagnosis Date Noted  . Hypercalcemia 04/13/2016  . Routine general medical examination at a health care facility 12/02/2015  . 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 Upper airway cough syndrome 10/04/2014  . Anxiety state 10/04/2014  . Essential hypertension 10/04/2014  . Obesity 10/04/2014    Past Surgical History:  Procedure Laterality Date  . ABDOMINAL HYSTERECTOMY    . TONSILLECTOMY AND ADENOIDECTOMY  1961    OB History    No data available       Home Medications    Prior to Admission medications   Medication Sig Start Date End Date Taking? Authorizing Provider  albuterol (PROVENTIL HFA;VENTOLIN HFA) 108 (90 BASE) MCG/ACT inhaler Inhale 1-2 puffs into the lungs every 6 (six) hours as needed for wheezing. 02/24/15  Yes Collene GobbleSteven A Daub, MD  budesonide-formoterol (SYMBICORT) 160-4.5 MCG/ACT inhaler Inhale 2 puffs into the lungs 2 (two) times daily. 03/23/16  Yes Michele McalpineScott M Nadel, MD  esomeprazole (NEXIUM) 20 MG capsule Take 1 capsule (20 mg total) by mouth daily at  12 noon. 12/02/15  Yes Myrlene BrokerElizabeth A Crawford, MD  fluticasone (FLONASE) 50 MCG/ACT nasal spray Place 2 sprays into both nostrils daily. 12/02/15  Yes Myrlene BrokerElizabeth A Crawford, MD  loratadine (CLARITIN) 10 MG tablet Take 10 mg by mouth daily.   Yes Historical Provider, MD  triamterene-hydrochlorothiazide (MAXZIDE-25) 37.5-25 MG tablet Take 1 tablet by mouth daily. 03/26/16  Yes Myrlene BrokerElizabeth A Crawford, MD  chlorpheniramine-HYDROcodone [facility](TUSSIONEX PENNKINETIC ER) 10-8 MG/5ML SUER Take 5 mLs by mouth every 12 (twelve) hours as needed for cough. 05/17/16   Charm RingsErin J Honig, MD  Dexlansoprazole (DEXILANT) 30 MG capsule Take 1 capsule (30 mg total) by mouth daily. 12/12/15   Myrlene BrokerElizabeth A Crawford, MD  predniSONE (DELTASONE) 10 MG tablet Take 6 tablets on day 1, 5 on day 2, 4 on day 3, 3 on day 4, 2 on day 5, and 1 on day 6. 05/17/16   Charm RingsErin J Honig, MD  SYMBICORT 160-4.5 MCG/ACT inhaler INHALE TWO PUFFS BY MOUTH TWICE DAILY 03/25/16   Michele McalpineScott M Nadel, MD    Family History Family History  Problem Relation Age of Onset  . Hypertension Mother   . Heart failure Mother   . Alcohol abuse Mother   . Heart disease Mother   . Alcohol abuse Father   . Heart disease Father   . Alcohol abuse Brother   . Heart disease Maternal Aunt   . Heart disease Maternal Uncle  Social History Social History  Substance Use Topics  . Smoking status: Passive Smoke Exposure - Never Smoker  . Smokeless tobacco: Never Used  . Alcohol use 0.0 oz/week     Allergies   Patient has no known allergies.   Review of Systems Review of Systems As in history of present illness  Physical Exam Triage Vital Signs ED Triage Vitals  Enc Vitals Group     BP 05/17/16 1717 158/94     Pulse Rate 05/17/16 1717 97     Resp 05/17/16 1717 14     Temp 05/17/16 1717 98.2 F (36.8 C)     Temp Source 05/17/16 1717 Oral     SpO2 05/17/16 1717 97 %     Weight --      Height --      Head Circumference --      Peak Flow --      Pain Score 05/17/16  1726 0     Pain Loc --      Pain Edu? --      Excl. in GC? --    No data found.   Updated Vital Signs BP 158/94 (BP Location: Left Arm)   Pulse 97   Temp 98.2 F (36.8 C) (Oral)   Resp 14   SpO2 97%   Visual Acuity Right Eye Distance:   Left Eye Distance:   Bilateral Distance:    Right Eye Near:   Left Eye Near:    Bilateral Near:     Physical Exam  Constitutional: She is oriented to person, place, and time. She appears well-developed and well-nourished. No distress.  HENT:  Mouth/Throat: No oropharyngeal exudate.  TMs normal bilaterally  Neck: Neck supple.  Cardiovascular: Normal rate, regular rhythm and normal heart sounds.   No murmur heard. Pulmonary/Chest: Effort normal and breath sounds normal. No respiratory distress. She has no wheezes. She has no rales.  Lymphadenopathy:    She has no cervical adenopathy.  Neurological: She is alert and oriented to person, place, and time.     UC Treatments / Results  Labs (all labs ordered are listed, but only abnormal results are displayed) Labs Reviewed - No data to display  EKG  EKG Interpretation None       Radiology No results found.  Procedures Procedures (including critical care time)  Medications Ordered in UC Medications - No data to display   Initial Impression / Assessment and Plan / UC Course  I have reviewed the triage vital signs and the nursing notes.  Pertinent labs & imaging results that were available during my care of the patient were reviewed by me and considered in my medical decision making (see chart for details).  Clinical Course     Likely post viral cough. Treat with prednisone taper and Tussionex. Follow-up as needed.  Final Clinical Impressions(s) / UC Diagnoses   Final diagnoses:  Viral URI with cough    New Prescriptions New Prescriptions   CHLORPHENIRAMINE-HYDROCODONE (TUSSIONEX PENNKINETIC ER) 10-8 MG/5ML SUER    Take 5 mLs by mouth every 12 (twelve) hours as  needed for cough.   PREDNISONE (DELTASONE) 10 MG TABLET    Take 6 tablets on day 1, 5 on day 2, 4 on day 3, 3 on day 4, 2 on day 5, and 1 on day 6.     Charm RingsErin J Honig, MD 05/17/16 [phone] [id number]

## 2016-05-17 NOTE — Discharge Instructions (Signed)
There is no sign of bacterial infection. Your symptoms are coming from a virus. Take the prednisone taper as prescribed. Use the Tussionex at bedtime to help with the cough. You should see improvement over the next several days, but the cough may linger for 1-2 months.

## 2016-06-23 ENCOUNTER — Telehealth: Payer: Self-pay | Admitting: Pulmonary Disease

## 2016-06-23 MED ORDER — LEVOFLOXACIN 500 MG PO TABS: 500.0000 mg | ORAL_TABLET | Freq: Every day | ORAL | 0 refills | Status: DC

## 2016-06-23 NOTE — Telephone Encounter (Signed)
Spoke with pt. States that she is not feeling well. Reports cough and wheezing. Cough is producing yellow mucus. Denies chest tightness, SOB or fever. Would like to have something sent in.  SN - please advise.  No Known Allergies

## 2016-06-23 NOTE — Telephone Encounter (Signed)
Spoke with pt, aware that we are awaiting SN's recs.  Advised that we will call her before we leave today.  Pt expressed understanding.  SN please advise on recs.  Thanks!

## 2016-06-23 NOTE — Telephone Encounter (Signed)
Pt calling to check on the status of med request.Theresa Terry

## 2016-06-23 NOTE — Telephone Encounter (Signed)
Called and spoke with pt and she is aware that per SN--can call in levaquin 500 mg  1 daily x 7 days.  She is aware that she will need to come in and be seen by SN in January.  Pt has scheduled an appt on 1-18 to see SN.  She is aware of med that has been called to the pharmacy.

## 2016-07-16 ENCOUNTER — Ambulatory Visit: Payer: Medicare (Managed Care) | Admitting: Pulmonary Disease

## 2016-07-26 IMAGING — CR DG CHEST 2V
2 series · 2 of 2 positions shown · non-contrast
Comparison: 05/29/2013

CLINICAL DATA: Chest tightness, sudden onset last night

EXAM:
CHEST  2 VIEW

[w chest pa]
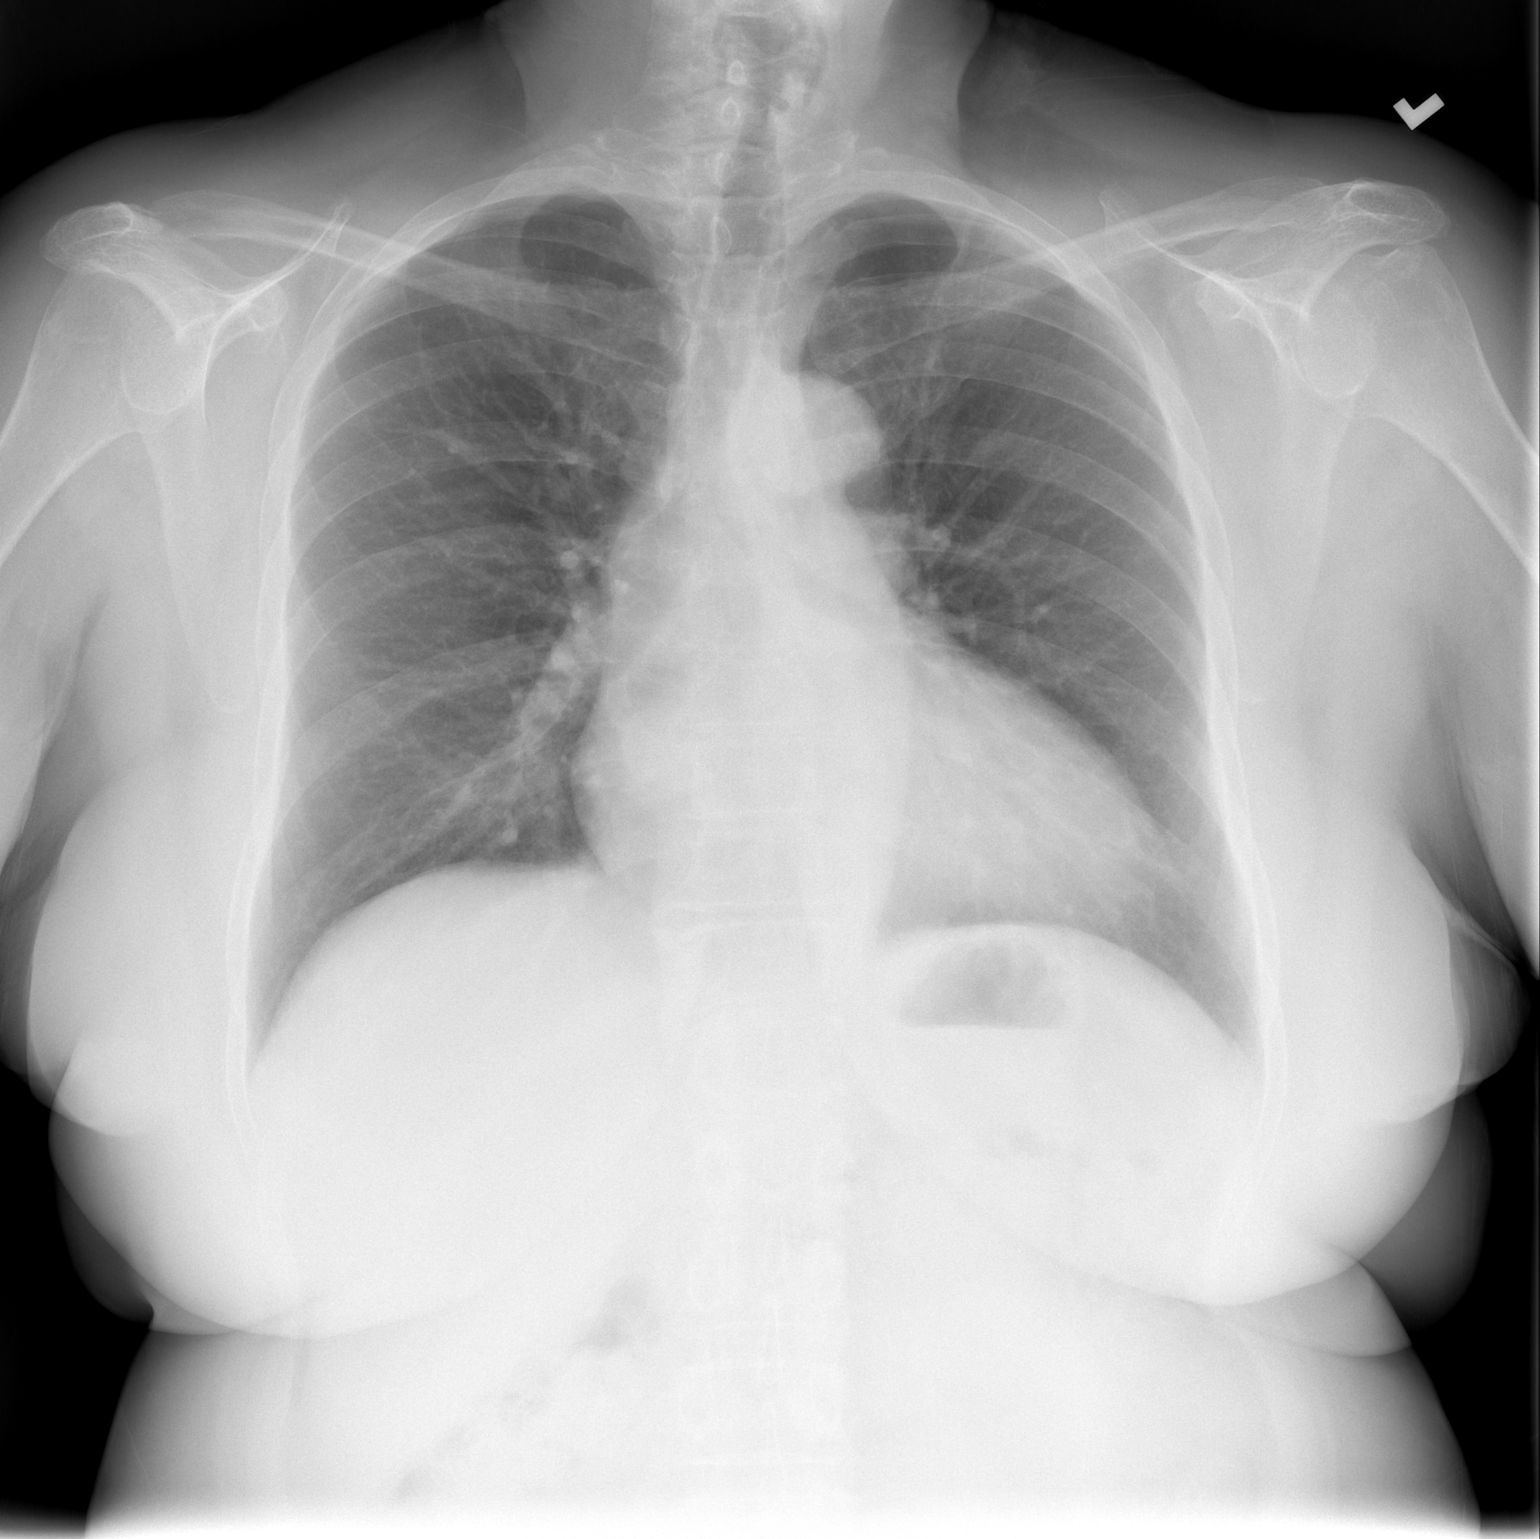

[w chest lat]
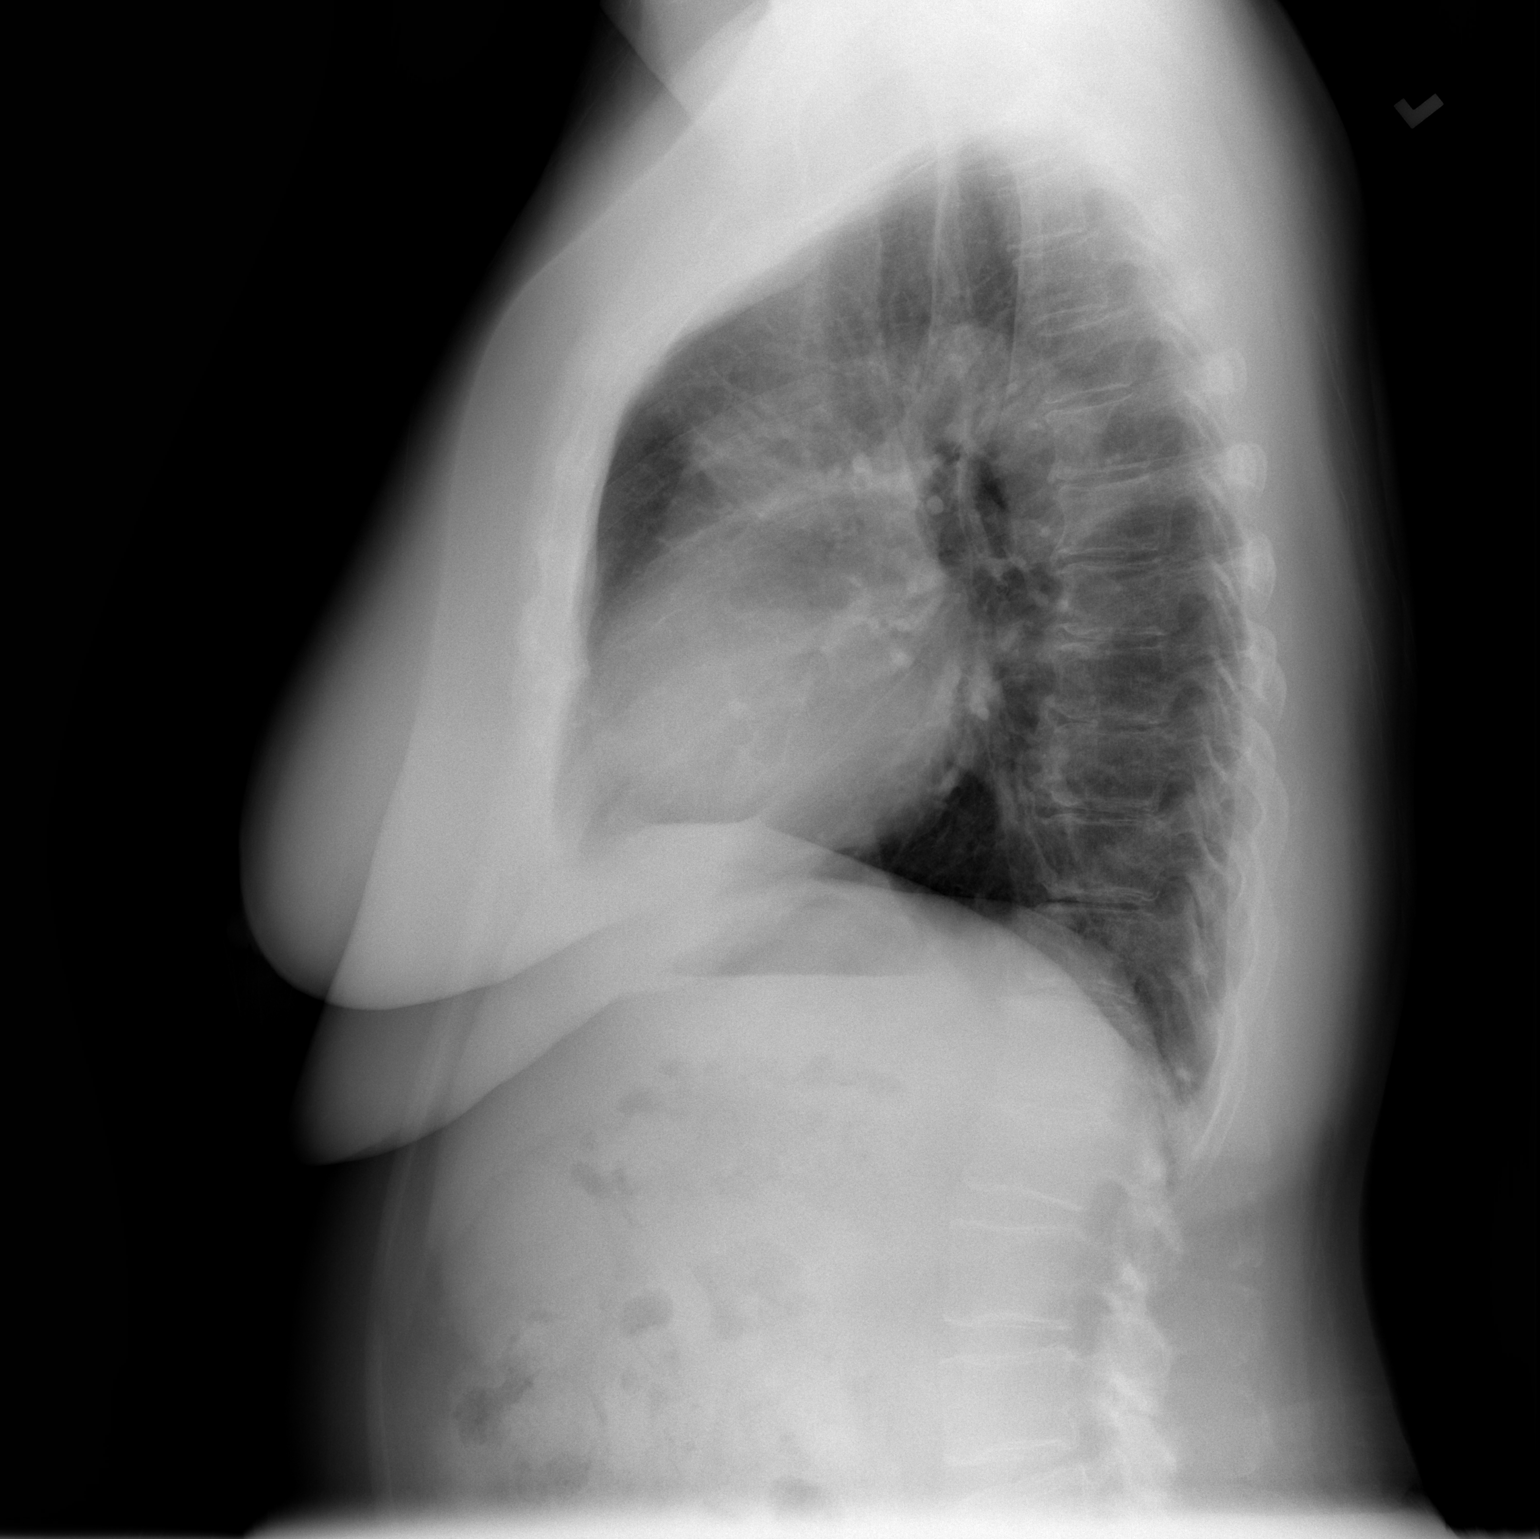

[2 of 2 positions shown; findings below may reference images not displayed]

FINDINGS: Stable mild cardiac enlargement. Vascular pattern is normal. No
consolidation effusion or pneumothorax.
IMPRESSION: No active cardiopulmonary disease.

## 2016-08-07 DIAGNOSIS — H40013 Open angle with borderline findings, low risk, bilateral: Secondary | ICD-10-CM | POA: Diagnosis not present

## 2016-08-10 ENCOUNTER — Other Ambulatory Visit (INDEPENDENT_AMBULATORY_CARE_PROVIDER_SITE_OTHER): Payer: Medicare (Managed Care)

## 2016-08-10 ENCOUNTER — Encounter: Payer: Self-pay | Admitting: Pulmonary Disease

## 2016-08-10 ENCOUNTER — Ambulatory Visit (INDEPENDENT_AMBULATORY_CARE_PROVIDER_SITE_OTHER): Payer: Medicare (Managed Care) | Admitting: Pulmonary Disease

## 2016-08-10 ENCOUNTER — Ambulatory Visit (INDEPENDENT_AMBULATORY_CARE_PROVIDER_SITE_OTHER)
Admission: RE | Admit: 2016-08-10 | Discharge: 2016-08-10 | Disposition: A | Discharge status: Home / Self Care | Payer: Medicare (Managed Care) | Source: Ambulatory Visit | Attending: Pulmonary Disease | Admitting: Pulmonary Disease

## 2016-08-10 VITALS — BP 130/84 | HR 68 | Temp 96.8°F | Ht 61.0 in | Wt 173.4 lb

## 2016-08-10 DIAGNOSIS — J683 Other acute and subacute respiratory conditions due to chemicals, gases, fumes and vapors: Secondary | ICD-10-CM

## 2016-08-10 DIAGNOSIS — R062 Wheezing: Secondary | ICD-10-CM | POA: Diagnosis not present

## 2016-08-10 DIAGNOSIS — I10 Essential (primary) hypertension: Secondary | ICD-10-CM

## 2016-08-10 DIAGNOSIS — Z8679 Personal history of other diseases of the circulatory system: Secondary | ICD-10-CM | POA: Diagnosis not present

## 2016-08-10 DIAGNOSIS — K219 Gastro-esophageal reflux disease without esophagitis: Secondary | ICD-10-CM | POA: Diagnosis not present

## 2016-08-10 DIAGNOSIS — J4520 Mild intermittent asthma, uncomplicated: Secondary | ICD-10-CM

## 2016-08-10 DIAGNOSIS — Z0000 Encounter for general adult medical examination without abnormal findings: Secondary | ICD-10-CM | POA: Diagnosis not present

## 2016-08-10 LAB — CBC WITH DIFFERENTIAL/PLATELET
BASOS PCT: 0.9 % (ref 0.0–3.0)
Basophils Absolute: 0.1 10*3/uL (ref 0.0–0.1)
EOS ABS: 0.1 10*3/uL (ref 0.0–0.7)
Eosinophils Relative: 1.9 % (ref 0.0–5.0)
HEMATOCRIT: 45 % (ref 36.0–46.0)
Hemoglobin: 14.8 g/dL (ref 12.0–15.0)
LYMPHS PCT: 30.2 % (ref 12.0–46.0)
Lymphs Abs: 2 10*3/uL (ref 0.7–4.0)
MCHC: 33 g/dL (ref 30.0–36.0)
MCV: 92 fl (ref 78.0–100.0)
MONO ABS: 0.5 10*3/uL (ref 0.1–1.0)
Monocytes Relative: 6.7 % (ref 3.0–12.0)
NEUTROS ABS: 4.1 10*3/uL (ref 1.4–7.7)
Neutrophils Relative %: 60.3 % (ref 43.0–77.0)
PLATELETS: 301 10*3/uL (ref 150.0–400.0)
RBC: 4.89 Mil/uL (ref 3.87–5.11)
RDW: 14.2 % (ref 11.5–15.5)
WBC: 6.7 10*3/uL (ref 4.0–10.5)

## 2016-08-10 LAB — BASIC METABOLIC PANEL
BUN: 12 mg/dL (ref 6–23)
CALCIUM: 10.1 mg/dL (ref 8.4–10.5)
CHLORIDE: 107 meq/L (ref 96–112)
CO2: 29 meq/L (ref 19–32)
CREATININE: 0.79 mg/dL (ref 0.40–1.20)
GFR: 93.17 mL/min (ref >60.00)
Glucose, Bld: 101 mg/dL — ABNORMAL HIGH (ref 70–99)
Potassium: 4.1 mEq/L (ref 3.5–5.1)
Sodium: 142 mEq/L (ref 135–145)

## 2016-08-10 LAB — TSH: TSH: 1.64 u[IU]/mL (ref 0.35–4.50)

## 2016-08-10 LAB — PHOSPHORUS: Phosphorus: 2.1 mg/dL — ABNORMAL LOW (ref 2.3–4.6)

## 2016-08-10 MED ORDER — BUDESONIDE-FORMOTEROL FUMARATE 160-4.5 MCG/ACT IN AERO: 2.0000 | INHALATION_SPRAY | Freq: Two times a day (BID) | RESPIRATORY_TRACT | 11 refills | Status: DC

## 2016-08-10 MED ORDER — FLUTICASONE PROPIONATE 50 MCG/ACT NA SUSP: 2.0000 | Freq: Every day | NASAL | 6 refills | Status: DC

## 2016-08-10 MED ORDER — ALBUTEROL SULFATE HFA 108 (90 BASE) MCG/ACT IN AERS: 1.0000 | INHALATION_SPRAY | Freq: Four times a day (QID) | RESPIRATORY_TRACT | 11 refills | Status: DC | PRN

## 2016-08-10 MED ORDER — ALBUTEROL SULFATE (2.5 MG/3ML) 0.083% IN NEBU: 2.5000 mg | INHALATION_SOLUTION | Freq: Three times a day (TID) | RESPIRATORY_TRACT | 6 refills | Status: DC | PRN

## 2016-08-10 NOTE — Patient Instructions (Signed)
Today we updated your med list in our EPIC system...    Continue your current medications the same...  We refilled your SYMBICORT 160- 2 sprays twice daily as we discussed...  We also wrote a new prescription for ALBUTEROL for NEBULIZER--    Use in NEB machine up to 3 times daily as needed for wheezing...  Today we rechecked your follow up CXR & BLOOD WORK including an evaluation for the elevated calcium in your blood    We will contact you w/ the results when available...   Call for any questions...  Let's plan a follow up visit in 80mo, sooner if needed for breathing problems.Marland Kitchen..Marland Kitchen

## 2016-08-10 NOTE — Progress Notes (Signed)
Subjective:     Patient ID: Theresa Terry, female   DOB: 1950/11/21, 66 y.o.   MRN: [id number]  HPI 66 y/o BF w/ AB/ RADS- primary triggers are URIs, norm PFTs 10/2014, GERD & LPR...   ~  March 07, 2015:  Initial pulmonary consult w/ SN>  8 y/o BF, referred by Theresa Terry for a pulmonary evaluation due to recurrent asthmatic bronchitis symptoms>       Theresa Terry is a never smoker w/ hx of recurrent upper resp infections that lead to persistent cough, chest congestion, wheezing, and dyspnea;  She notes that the infections are "hard to shake" and the symptoms "come right back when I finish the meds";  She has had OVs monthly since [id number] betw Theresa Terry, [facility], & Theresa Terry- over this time frame she has been treated w/ ZPak, Pred, Flovent then Qvar, Ventolin rescue, Claritin, Flonase, Tessalon;  She had similar problem in 2014 w/ several ER visits & she notes that all the meds seem to help but the symptoms come back when she stops them... The main trigger seems to be upper resp infections, BUT she also c/o cough & wheezing at nights & AM cough as well- she checked this out on-line & wonders if it could relate to her prob...       Theresa Terry was prev employed at [employer] in the 1970s; she had a job in [employer] & worked a lot in [employer]- while it was very dusty she was not around the raw cotton/ carding room/ etc; she similarly denies any asbestos exposure or other known toxic exposures, currently employed cleaning houses...  FamHx is neg for lung problems...      EXAM reveals Afeb, VSS, O2sat=98% on RA;  HEENT- neg, mallampati1;  Chest- clear w/o w/r/r, no congestion, cough is dry;  Heart- RR w/o m/r/g;  Abd- soft, nontender, neg;  Ext- neg w/o c/c/e...  CXR 02/24/15 showed mild cardiomegaly, clear lungs, NAD and unchanged from old films...   PFT 11/12/14 showed FVC=2.54 (114), FEV1=1.97 (114), %1sec=77, mid-flows are wnl at 101% predicted; TLC & RV were mildly reduced and DLCO was  wnl...   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 w/ her main triggers usually upper resp infections;  This pattern has been repeated over the yrs starting in ?2014 & she usually responds to antibiotics, Pred, inhaled bronchodil & ICS;  The pattern over the last several months is c/w this diagnosis & she finally improved w/ the Pred;  I believe that she would benefit from an ICS/LABA combination inhaler to use regularly & hopefully prevent the freq recurrences that tended to occur when she runs out of meds;  She stopped prev Flovent & never filled the Qvar due to cost ($200);  We checked in Epic & her insurance should cover SYMBICORT160- 2spBid;  She has VENTOLIN rescue inhaler to use 1-2sp Q6H prn;  Her other issue appears to be GERD/ reflux w/ nocturnal & early AM symptoms- we discussed a vigorous antireflux regimen w/ Protonix40, NPO after dinner, elev HOB 6" (see AVS);  She will call w/ questions or problems and we plan recheck in 6 weeks...  ~  April 18, 2015:  6wk ROV w/ SN>  Theresa Terry returns on her Symbicort- 2spBid, Ventolin rescue inhaler (rarely uses), and her vigorous antireflux regimen (Protonix40 before dinner,  NPO after dinner, elev HOB 6")- much improved, resting better, no nighttime cough & improved days; notes min cough (some drainage), no sput, no hemoptysis, denies dyspnea but she is still too sedentary & we discussed the importance of exercise; NOTE> she refuses vaccines...     AR> on Claritin & Flonase doing satis...    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    GERD & prob LPR> improved on Protonix40Qhs, NPO after dinner, elev HOB 6"...    Medical issues>  HBP on Maxzide-25, DJD, Anxiety- followed by Theresa Terry...  EXAM reveals Afeb,  VSS, O2sat=98% on RA;  HEENT- neg, mallampati1;  Chest- clear w/o w/r/r, no congestion, cough is dry;  Heart- RR w/o m/r/g;  Abd- soft, nontender, neg;  Ext- neg w/o c/c/e... IMP/PLAN>>  Theresa Terry is improved on her inhalers regularly & by following a vigorous antireflux regimen for her GERD/LPR; continue same rx...  ~  October 17, 2015:  42mo ROV & Theresa Terry reports that she continues to do well, no recurrent AB episodes or refractory attacks; she tells me that she stopped the Symbicort160 on her own & now uses it just prn, and she hasn't needed the rescue inhaler at all; she does continue on the vigorous antireflux regimen (Protonix40 before dinner, NPO after dinner, elev HOB 6") & this seems to have made all the difference for her... we reviewed the following medical problems during today's office visit >>     AR> on Claritin & Flonase doing satis...    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    GERD & prob LPR> improved on Protonix40Qhs, NPO after dinner, elev HOB 6"...    Medical issues>  HBP on Maxzide-25, DJD, Anxiety, she refuses vaccines- followed by Theresa Terry... NOTE: prev elev CALCIUM level at 11.1 Apr2016 needs recheck w/ PTH level to r/o primary hyperpara... EXAM reveals Afeb, VSS, O2sat=94% on RA;  HEENT- neg, mallampati1;  Chest- clear w/o w/r/r, no congestion, cough is dry;  Heart- RR w/o m/r/g;  Abd- soft, nontender, neg;  Ext- neg w/o c/c/e IMP/PLAN>>  Theresa Terry continues to do well from the pulm standpoint; she should take her Symbicort more regularly & definitely needs to incr her exercise program; continue antireflux regimen; we discussed the needed adult vaccinations (she continues to refuse vaccines); rec f/u w/ Pulm prn...   ~  August 10, 2016:  10 month ROV & Theresa Terry is a never smoker w/ hx recurrent URIs/ AB/ RADS/  LPR & GERD; she tells me that she bought a nebulizer on her own & wants medication to put in it if needs- we discussed Albuterol 0.83... We reviewed the following medical problems during today's office visit >>     AR> on Claritin & Flonase doing satis...    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    GERD & prob LPR> improved on Nexium20/d, NPO after dinner, elev HOB 6"...    Medical issues>  HBP on Maxzide-25; Hyperparathyroidism w/ elev Ca & PTH,  plus low Phos; DJD; Anxiety; she refuses all vaccines- followed by Theresa Terry... EXAM reveals Afeb, VSS, O2sat=100% on RA;  HEENT- neg w/o nodules, mallampati1;  Chest- clear w/o w/r/r, no congestion, cough is dry;  Heart- RR w/o m/r/g;  Abd- soft, nontender, neg;  Ext- neg w/o c/c/e  CXR 08/10/16>  Borderline heart size, clear lungs, NAD...  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    Past Medical History  Diagnosis Date  . Hypertension >> on Maxzide-25   . Asthma >> SEE ABOVE   . Arthritis   . GERD (gastroesophageal reflux disease) > adding Nexium240 & antireflux regimen   . Allergy    Hyperparathyroidisn w/ sl elev Ca, reduced Phos, and elev PTH level - on observation   . Cataract     Past Surgical History:  Procedure Laterality Date  . ABDOMINAL HYSTERECTOMY    . TONSILLECTOMY AND ADENOIDECTOMY  1961    Outpatient Encounter Prescriptions as of 08/10/2016  Medication Sig   . albuterol (PROVENTIL HFA;VENTOLIN HFA) 108 (90 Base) MCG/ACT inhaler Inhale 1-2 puffs into the lungs every 6 (six) hours as needed for wheezing.  . budesonide-formoterol (SYMBICORT) 160-4.5 MCG/ACT inhaler Inhale 2 puffs into the lungs 2 (two) times daily.  . budesonide-formoterol (SYMBICORT) 160-4.5 MCG/ACT inhaler Inhale 2 puffs into the lungs 2 (two) times daily.  Marland Kitchen esomeprazole (NEXIUM) 20 MG capsule Take 1 capsule (20 mg total) by mouth daily at 12 noon.  . fluticasone (FLONASE) 50 MCG/ACT nasal spray Place 2 sprays into both nostrils daily.  Marland Kitchen loratadine (CLARITIN) 10 MG tablet Take 10 mg by mouth daily.  Marland Kitchen triamterene-hydrochlorothiazide (MAXZIDE-25) 37.5-25 MG tablet Take 1 tablet by mouth daily.  . [DISCONTINUED] albuterol (PROVENTIL HFA;VENTOLIN HFA) 108 (90 BASE) MCG/ACT inhaler Inhale 1-2 puffs into the lungs every 6 (six) hours as needed for wheezing.  . [DISCONTINUED] fluticasone (FLONASE) 50 MCG/ACT nasal spray Place 2 sprays into both nostrils daily.  . [DISCONTINUED] SYMBICORT 160-4.5 MCG/ACT inhaler INHALE TWO PUFFS BY MOUTH TWICE DAILY  . albuterol (PROVENTIL) (2.5 MG/3ML) 0.083% nebulizer solution Take 3 mLs (2.5 mg total) by nebulization 3 (three) times daily as needed for wheezing or shortness of breath.  . [DISCONTINUED] chlorpheniramine-HYDROcodone (TUSSIONEX PENNKINETIC ER) 10-8 MG/5ML SUER Take 5 mLs by mouth every 12 (twelve) hours as needed for cough. (Patient not taking: Reported on 08/10/2016)  . [DISCONTINUED] Dexlansoprazole (DEXILANT) 30 MG capsule Take 1 capsule (30 mg total) by mouth daily. (Patient not taking: Reported on 08/10/2016)  . [DISCONTINUED] levofloxacin (LEVAQUIN) 500 MG tablet Take 1 tablet (500 mg total) by mouth daily. (Patient not taking: Reported on 08/10/2016)  . [DISCONTINUED] predniSONE (DELTASONE) 10 MG tablet Take 6 tablets on day 1, 5 on day 2, 4 on day 3, 3 on day 4, 2 on day 5, and 1 on day 6. (Patient not taking: Reported on 08/10/2016)    No facility-administered encounter medications on file as of 08/10/2016.     No Known Allergies    There is no immunization history on file for this patient. She needs to be sure that she gets all her needed vaccines & will check w/ her PCP.   Current Medications, Allergies, Past Medical  History, Past Surgical History, Family History, and Social History were reviewed in [employer] record.   Review of Systems            All symptoms NEG except where BOLDED >>  Constitutional:  F/C/S, fatigue, anorexia, unexpected weight change. HEENT:  HA, visual changes, hearing loss, earache, nasal symptoms, sore throat, mouth sores, hoarseness. Resp:  cough, sputum, hemoptysis; SOB, tightness, wheezing. Cardio:  CP, palpit, DOE, orthopnea, edema. GI:  N/V/D/C, blood in stool; reflux, abd pain, distention, gas. GU:  dysuria, freq, urgency, hematuria, flank pain, voiding difficulty. MS:  joint pain, swelling, tenderness, decr ROM; neck pain, back pain, etc. Neuro:  HA, tremors, seizures, dizziness, syncope, weakness, numbness, gait abn. Skin:  suspicious lesions or skin rash. Heme:  adenopathy, bruising, bleeding. Psyche:  confusion, agitation, sleep disturbance, hallucinations, anxiety, depression suicidal.   Objective:   Physical Exam      Vital Signs:  Reviewed...   General:  WD, WN, 66 y/o BF in NAD; alert & oriented; pleasant & cooperative... HEENT:  [place]/AT; Conjunctiva- pink, Sclera- nonicteric, EOM-wnl, PERRLA, EACs-clear, TMs-wnl; NOSE-clear; THROAT-clear & wnl.  Neck:  Supple w/ fairROM; no JVD; normal carotid impulses w/o bruits; no thyromegaly or nodules palpated; no lymphadenopathy.  Chest:  Clear to P & A; without wheezes, rales, or rhonchi heard. Heart:  Regular Rhythm; norm S1 & S2 without murmurs, rubs, or gallops detected. Abdomen:  Soft & nontender- no guarding or rebound; normal bowel sounds; no organomegaly or masses palpated. Ext:  DecrROM; without  deformities +arthritic changes; no varicose veins, venous insuffic, or edema;  Pulses intact w/o bruits. Neuro:  No focal neuro deficits; sensory testing normal; gait normal & balance OK. Derm:  No lesions noted; no rash etc. Lymph:  No cervical, supraclavicular, axillary, or inguinal adenopathy palpated.   Assessment:      IMP >>     AR> on Claritin & Flonase doing satis...    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    GERD & prob LPR> improved on Protonix40Qhs, NPO after dinner, elev HOB 6"...    Medical issues>  HBP on Maxzide-25, Hyperparathyroidism , DJD, Anxiety, she refuses vaccines- followed by Theresa Terry...  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 w/ her main triggers usually upper resp infections;  This pattern has been repeated over the yrs starting in ?2014 & she usually responds to antibiotics, Pred, inhaled bronchodil & ICS;  The pattern over the last several months is c/w this diagnosis & she finally improved w/ the Pred;  I believe that she would benefit from an ICS/LABA combination inhaler to use regularly & hopefully prevent the freq recurrences that tended to occur when she runs out of meds;  She stopped prev Flovent & never filled the Qvar due to cost ($200);  We checked in Epic & her insurance should cover SYMBICORT160- 2spBid;  She has VENTOLIN rescue inhaler to use 1-2sp Q6H prn;  Her other issue appears to be GERD/ reflux w/ nocturnal & early AM symptoms- we discussed a vigorous antireflux regimen w/ Protonix40, NPO after dinner, elev HOB 6" (see AVS);  She will call w/ questions or problems and we plan recheck in 6 weeks. 04/18/15>   Theresa Terry is improved on her inhalers regularly & by following a vigorous antireflux regimen for  her GERD/LPR; continue same rx 10/17/15>   Theresa Terry continues to do  well from the pulm standpoint; she should take her Symbicort more regularly & definitely needs to incr her exercise program; continue antireflux regimen; we discussed the needed adult vaccinations (she continues to refuse vaccines); rec f/u w/ Pulm prn 08/10/16>   Theresa Terry is stable on her current regimen but needs to take the Symbicort160-2spBid regularly; in addition we will provide her w/ ALBUTEROL0.83 for prn use; 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   Plan:     Patient's Medications  New Prescriptions   ALBUTEROL (PROVENTIL) (2.5 MG/3ML) 0.083% NEBULIZER SOLUTION    Take 3 mLs (2.5 mg total) by nebulization 3 (three) times daily as needed for wheezing or shortness of breath.  Previous Medications   BUDESONIDE-FORMOTEROL (SYMBICORT) 160-4.5 MCG/ACT INHALER    Inhale 2 puffs into the lungs 2 (two) times daily.   ESOMEPRAZOLE (NEXIUM) 20 MG CAPSULE    Take 1 capsule (20 mg total) by mouth daily at 12 noon.   LORATADINE (CLARITIN) 10 MG TABLET    Take 10 mg by mouth daily.   TRIAMTERENE-HYDROCHLOROTHIAZIDE (MAXZIDE-25) 37.5-25 MG TABLET    Take 1 tablet by mouth daily.  Modified Medications   Modified Medication Previous Medication   ALBUTEROL (PROVENTIL HFA;VENTOLIN HFA) 108 (90 BASE) MCG/ACT INHALER albuterol (PROVENTIL HFA;VENTOLIN HFA) 108 (90 BASE) MCG/ACT inhaler      Inhale 1-2 puffs into the lungs every 6 (six) hours as needed for wheezing.    Inhale 1-2 puffs into the lungs every 6 (six) hours as needed for wheezing.   BUDESONIDE-FORMOTEROL (SYMBICORT) 160-4.5 MCG/ACT INHALER SYMBICORT 160-4.5 MCG/ACT inhaler      Inhale 2 puffs into the lungs 2 (two) times daily.    INHALE TWO PUFFS BY MOUTH TWICE DAILY   FLUTICASONE (FLONASE) 50 MCG/ACT NASAL SPRAY fluticasone (FLONASE) 50 MCG/ACT nasal spray      Place 2 sprays into both nostrils daily.    Place 2 sprays into both nostrils daily.  Discontinued Medications    CHLORPHENIRAMINE-HYDROCODONE (TUSSIONEX PENNKINETIC ER) 10-8 MG/5ML SUER    Take 5 mLs by mouth every 12 (twelve) hours as needed for cough.   DEXLANSOPRAZOLE (DEXILANT) 30 MG CAPSULE    Take 1 capsule (30 mg total) by mouth daily.   LEVOFLOXACIN (LEVAQUIN) 500 MG TABLET    Take 1 tablet (500 mg total) by mouth daily.   PREDNISONE (DELTASONE) 10 MG TABLET    Take 6 tablets on day 1, 5 on day 2, 4 on day 3, 3 on day 4, 2 on day 5, and 1 on day 6.

## 2016-08-11 LAB — PTH, INTACT AND CALCIUM
CALCIUM: 9.9 mg/dL (ref 8.6–10.4)
PTH: 90 pg/mL — ABNORMAL HIGH (ref 14–64)

## 2016-08-24 ENCOUNTER — Telehealth: Payer: Self-pay | Admitting: Pulmonary Disease

## 2016-08-24 MED ORDER — ALBUTEROL SULFATE (2.5 MG/3ML) 0.083% IN NEBU: 2.5000 mg | INHALATION_SOLUTION | Freq: Three times a day (TID) | RESPIRATORY_TRACT | 6 refills | Status: AC | PRN

## 2016-08-24 NOTE — Telephone Encounter (Signed)
Spoke with pt. She is needing her albuterol neb solution prescription sent to her pharmacy again with the dx code. Rx has been resent. Nothing further was needed.

## 2017-01-22 DIAGNOSIS — H43811 Vitreous degeneration, right eye: Secondary | ICD-10-CM | POA: Diagnosis not present

## 2017-02-08 ENCOUNTER — Ambulatory Visit (INDEPENDENT_AMBULATORY_CARE_PROVIDER_SITE_OTHER): Payer: Medicaid Other | Admitting: Pulmonary Disease

## 2017-02-08 ENCOUNTER — Encounter: Payer: Self-pay | Admitting: Pulmonary Disease

## 2017-02-08 ENCOUNTER — Other Ambulatory Visit: Payer: Medicare (Managed Care)

## 2017-02-08 VITALS — BP 136/84 | HR 85 | Temp 98.1°F | Ht 61.0 in | Wt 175.1 lb

## 2017-02-08 DIAGNOSIS — E663 Overweight: Secondary | ICD-10-CM

## 2017-02-08 DIAGNOSIS — I10 Essential (primary) hypertension: Secondary | ICD-10-CM

## 2017-02-08 DIAGNOSIS — F411 Generalized anxiety disorder: Secondary | ICD-10-CM

## 2017-02-08 DIAGNOSIS — K219 Gastro-esophageal reflux disease without esophagitis: Secondary | ICD-10-CM

## 2017-02-08 DIAGNOSIS — J683 Other acute and subacute respiratory conditions due to chemicals, gases, fumes and vapors: Secondary | ICD-10-CM

## 2017-02-08 DIAGNOSIS — E213 Hyperparathyroidism, unspecified: Secondary | ICD-10-CM | POA: Diagnosis not present

## 2017-02-08 NOTE — Progress Notes (Signed)
Subjective:     Patient ID: Theresa Terry, female   DOB: 1951-05-14, 66 y.o.   MRN: [id number]  HPI 65 y/o BF w/ AB/ RADS- primary triggers are URIs, norm PFTs 10/2014, GERD & LPR...   ~  March 07, 2015:  Initial pulmonary consult w/ SN>  71 y/o BF, referred by DrDaub for a pulmonary evaluation due to recurrent asthmatic bronchitis symptoms>       Theresa Terry is a never smoker w/ hx of recurrent upper resp infections that lead to persistent cough, chest congestion, wheezing, and dyspnea;  She notes that the infections are "hard to shake" and the symptoms "come right back when I finish the meds";  She has had OVs monthly since [id number] betw DrKollar, [facility], & DrHopper- over this time frame she has been treated w/ ZPak, Pred, Flovent then Qvar, Ventolin rescue, Claritin, Flonase, Tessalon;  She had similar problem in 2014 w/ several ER visits & she notes that all the meds seem to help but the symptoms come back when she stops them... The main trigger seems to be upper resp infections, BUT she also c/o cough & wheezing at nights & AM cough as well- she checked this out on-line & wonders if it could relate to her prob...       Theresa Terry was prev employed at [employer] in the 1970s; she had a job in Print[employer] & worked a lot in [employer]- while it was very dusty she was not around the raw cotton/ carding room/ etc; she similarly denies any asbestos exposure or other known toxic exposures, currently employed cleaning houses...  FamHx is neg for lung problems...      EXAM reveals Afeb, VSS, O2sat=98% on RA;  HEENT- neg, mallampati1;  Chest- clear w/o w/r/r, no congestion, cough is dry;  Heart- RR w/o m/r/g;  Abd- soft, nontender, neg;  Ext- neg w/o c/c/e...  CXR 02/24/15 showed mild cardiomegaly, clear lungs, NAD and unchanged from old films...   PFT 11/12/14 showed FVC=2.54 (114), FEV1=1.97 (114), %1sec=77, mid-flows are wnl at 101% predicted; TLC & RV were mildly reduced and DLCO was  wnl...   LABS 4-8/16 showed:  Chems- wnl x Ca=11.1 & needs f/u repeat by her PCP;  CBC- wnl, eos=2%;  A1c=5.9.Marland KitchenMarland Kitchen      IMP/PLAN>>  Serenity appears to have AB/ RADS w/ her main triggers usually upper resp infections;  This pattern has been repeated over the yrs starting in ?2014 & she usually responds to antibiotics, Pred, inhaled bronchodil & ICS;  The pattern over the last several months is c/w this diagnosis & she finally improved w/ the Pred;  I believe that she would benefit from an ICS/LABA combination inhaler to use regularly & hopefully prevent the freq recurrences that tended to occur when she runs out of meds;  She stopped prev Flovent & never filled the Qvar due to cost ($200);  We checked in Epic & her insurance should cover SYMBICORT160- 2spBid;  She has VENTOLIN rescue inhaler to use 1-2sp Q6H prn;  Her other issue appears to be GERD/ reflux w/ nocturnal & early AM symptoms- we discussed a vigorous antireflux regimen w/ Protonix40, NPO after dinner, elev HOB 6" (see AVS);  She will call w/ questions or problems and we plan recheck in 6 weeks...  ~  April 18, 2015:  6wk ROV w/ SN>  [facility]t returns on her Symbicort- 2spBid, Ventolin rescue inhaler (rarely uses), and her vigorous antireflux regimen (Protonix40 before dinner,  NPO after dinner, elev HOB 6")- much improved, resting better, no nighttime cough & improved days; notes min cough (some drainage), no sput, no hemoptysis, denies dyspnea but she is still too sedentary & we discussed the importance of exercise; NOTE> she refuses vaccines...     AR> on Claritin & Flonase doing satis...    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    GERD & prob LPR> improved on Protonix40Qhs, NPO after dinner, elev HOB 6"...    Medical issues>  HBP on Maxzide-25, DJD, Anxiety- followed by DrCrawford...  EXAM reveals Afeb,  VSS, O2sat=98% on RA;  HEENT- neg, mallampati1;  Chest- clear w/o w/r/r, no congestion, cough is dry;  Heart- RR w/o m/r/g;  Abd- soft, nontender, neg;  Ext- neg w/o c/c/e... IMP/PLAN>>  Theresa Terry is improved on her inhalers regularly & by following a vigorous antireflux regimen for her GERD/LPR; continue same rx...  ~  October 17, 2015:  60mo ROV & Theresa Terry reports that she continues to do well, no recurrent AB episodes or refractory attacks; she tells me that she stopped the Symbicort160 on her own & now uses it just prn, and she hasn't needed the rescue inhaler at all; she does continue on the vigorous antireflux regimen (Protonix40 before dinner, NPO after dinner, elev HOB 6") & this seems to have made all the difference for her... we reviewed the following medical problems during today's office visit >>     AR> on Claritin & Flonase doing satis...    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    GERD & prob LPR> improved on Protonix40Qhs, NPO after dinner, elev HOB 6"...    Medical issues>  HBP on Maxzide-25, DJD, Anxiety, she refuses vaccines- followed by DrCrawford... NOTE: prev elev CALCIUM level at 11.1 Apr2016 needs recheck w/ PTH level to r/o primary hyperpara... EXAM reveals Afeb, VSS, O2sat=94% on RA;  HEENT- neg, mallampati1;  Chest- clear w/o w/r/r, no congestion, cough is dry;  Heart- RR w/o m/r/g;  Abd- soft, nontender, neg;  Ext- neg w/o c/c/e IMP/PLAN>>  Theresa Terry continues to do well from the pulm standpoint; she should take her Symbicort more regularly & definitely needs to incr her exercise program; continue antireflux regimen; we discussed the needed adult vaccinations (she continues to refuse vaccines); rec f/u w/ Pulm prn...  ~  August 10, 2016:  10 month ROV & Theresa Terry is a never smoker w/ hx recurrent URIs/ AB/ RADS/  LPR & GERD; she tells me that she bought a nebulizer on her own & wants medication to put in it if needs- we discussed Albuterol 0.83... We reviewed the following medical problems during today's office visit >>     AR> on Claritin & Flonase doing satis...    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    GERD & prob LPR> improved on Nexium20/d, NPO after dinner, elev HOB 6"...    Medical issues>  HBP on Maxzide-25; Hyperparathyroidism w/ elev Ca & PTH, plus  low Phos; DJD; Anxiety; she refuses all vaccines- followed by DrCrawford... EXAM reveals Afeb, VSS, O2sat=100% on RA;  HEENT- neg w/o nodules, mallampati1;  Chest- clear w/o w/r/r, no congestion, cough is dry;  Heart- RR w/o m/r/g;  Abd- soft, nontender, neg;  Ext- neg w/o c/c/e  CXR 08/10/16>  Borderline heart size, clear lungs, NAD...  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   ~  February 08, 2017:  30mo ROV & last visit [facility] purchased a [employer] manager & we wrote for Prn Albuterol for Neb in addition to her Symbicort160-2spBid;  Today she reports doing well & hasn't needed the NEBs in the interval!  AND she is still NOT using the symbicort regularly- eg just twice last week;  She says breathing well, no cough/ sput/ hemoptysis, chr stable DOE when rushing or taking stairs otherw not exercising etc... We did follow up labs today. We reviewed the following medical problems during  today's office visit >>     AR> on Claritin & Flonase doing satis...    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    GERD & prob LPR> improved on Nexium20/d, NPO after dinner, elev HOB 6"...    Medical issues>  PCP is DrCrawford> HBP on Maxzide-25; Hyperparathyroidism w/ elev Ca & PTH & low Phos; DJD; Anxiety; she refuses all vaccines... EXAM reveals Afeb, VSS, O2sat=96% on RA;  HEENT- neg w/o nodules, mallampati1;  Chest- clear w/o w/r/r, no congestion, cough is dry;  Heart- RR w/o m/r/g;  Abd- soft, nontender, neg;  Ext- neg w/o c/c/e  LABS 02/08/17>  Ca=10.2;  PTH level = 63 (upper lim & not progressive IMP/PLAN>>  [facility] remains stable despite her poor compliance w/ medical recommendations- not using Symbicort regularly, but neither has she needed the rescue inhaler OR the NEBS;  She still steadfastly refuses all vaccinations;  We plan ROV recheck in 30mo...     Past Medical History  Diagnosis Date  . Hypertension >> on Maxzide-25   . Asthma >> SEE ABOVE   . Arthritis   . GERD (gastroesophageal reflux disease) > adding Nexium240 & antireflux regimen   . Allergy    Hyperparathyroidisn w/ sl elev Ca, reduced Phos, and elev PTH level - on observation   . Cataract     Past Surgical History:  Procedure Laterality Date  . ABDOMINAL HYSTERECTOMY    . TONSILLECTOMY AND ADENOIDECTOMY  1961    Outpatient Encounter Prescriptions as of 02/08/2017  Medication Sig  . albuterol (PROVENTIL HFA;VENTOLIN HFA) 108 (90 Base) MCG/ACT inhaler Inhale 1-2 puffs into the lungs every 6 (six) hours as needed for wheezing.  Marland Kitchen albuterol (PROVENTIL) (2.5 MG/3ML) 0.083% nebulizer solution Take 3 mLs (2.5 mg total) by nebulization 3 (three) times daily as needed for wheezing or shortness of breath.  .  budesonide-formoterol (SYMBICORT) 160-4.5 MCG/ACT inhaler Inhale 2 puffs into the lungs 2 (two) times daily.  Marland Kitchen esomeprazole (NEXIUM) 20 MG capsule Take 1 capsule (20 mg total) by mouth  daily at 12 noon.  . fluticasone (FLONASE) 50 MCG/ACT nasal spray Place 2 sprays into both nostrils daily.  Marland Kitchen loratadine (CLARITIN) 10 MG tablet Take 10 mg by mouth daily.  Marland Kitchen triamterene-hydrochlorothiazide (MAXZIDE-25) 37.5-25 MG tablet Take 1 tablet by mouth daily.  . [DISCONTINUED] budesonide-formoterol (SYMBICORT) 160-4.5 MCG/ACT inhaler Inhale 2 puffs into the lungs 2 (two) times daily. (Patient not taking: Reported on 02/08/2017)   No facility-administered encounter medications on file as of 02/08/2017.     No Known Allergies    There is no immunization history on file for this patient. >> she has refused vaccinations! She needs to be sure that she gets all her needed vaccines & will check w/ her PCP.   Current Medications, Allergies, Past Medical History, Past Surgical History, Family History, and Social History were reviewed in [employer] record.   Review of Systems            All symptoms NEG except where BOLDED >>  Constitutional:  F/C/S, fatigue, anorexia, unexpected weight change. HEENT:  HA, visual changes, hearing loss, earache, nasal symptoms, sore throat, mouth sores, hoarseness. Resp:  cough, sputum, hemoptysis; SOB, tightness, wheezing. Cardio:  CP, palpit, DOE, orthopnea, edema. GI:  N/V/D/C, blood in stool; reflux, abd pain, distention, gas. GU:  dysuria, freq, urgency, hematuria, flank pain, voiding difficulty. MS:  joint pain, swelling, tenderness, decr ROM; neck pain, back pain, etc. Neuro:  HA, tremors, seizures, dizziness, syncope, weakness, numbness, gait abn. Skin:  suspicious lesions or skin rash. Heme:  adenopathy, bruising, bleeding. Psyche:  confusion, agitation, sleep disturbance, hallucinations, anxiety, depression suicidal.   Objective:    Physical Exam      Vital Signs:  Reviewed...   General:  WD, WN, 66 y/o BF in NAD; alert & oriented; pleasant & cooperative... HEENT:  [place]/AT; Conjunctiva- pink, Sclera- nonicteric, EOM-wnl, PERRLA, EACs-clear, TMs-wnl; NOSE-clear; THROAT-clear & wnl.  Neck:  Supple w/ fairROM; no JVD; normal carotid impulses w/o bruits; no thyromegaly or nodules palpated; no lymphadenopathy.  Chest:  Clear to P & A; without wheezes, rales, or rhonchi heard. Heart:  Regular Rhythm; norm S1 & S2 without murmurs, rubs, or gallops detected. Abdomen:  Soft & nontender- no guarding or rebound; normal bowel sounds; no organomegaly or masses palpated. Ext:  DecrROM; without deformities +arthritic changes; no varicose veins, venous insuffic, or edema;  Pulses intact w/o bruits. Neuro:  No focal neuro deficits; sensory testing normal; gait normal & balance OK. Derm:  No lesions noted; no rash etc. Lymph:  No cervical, supraclavicular, axillary, or inguinal adenopathy palpated.   Assessment:      IMP >>     AR> on Claritin & Flonase doing satis...    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    GERD & prob LPR> improved on Protonix40Qhs, NPO after dinner, elev HOB 6"...    Medical issues>  HBP on Maxzide-25, Hyperparathyroidism , DJD, Anxiety, she refuses vaccines- followed by DrCrawford...  PLAN >>  03/07/15>   Jonathon appears to have AB/ RADS w/ her main triggers usually upper resp infections;  This pattern has been repeated over the yrs starting in ?2014 & she usually responds to antibiotics, Pred, inhaled bronchodil & ICS;  The pattern over the last several months is c/w this diagnosis & she finally improved w/ the Pred;  I  believe that she would benefit from an ICS/LABA combination inhaler to use regularly & hopefully prevent the freq  recurrences that tended to occur when she runs out of meds;  She stopped prev Flovent & never filled the Qvar due to cost ($200);  We checked in Epic & her insurance should cover SYMBICORT160- 2spBid;  She has VENTOLIN rescue inhaler to use 1-2sp Q6H prn;  Her other issue appears to be GERD/ reflux w/ nocturnal & early AM symptoms- we discussed a vigorous antireflux regimen w/ Protonix40, NPO after dinner, elev HOB 6" (see AVS);  She will call w/ questions or problems and we plan recheck in 6 weeks. 04/18/15>   Theresa Terry is improved on her inhalers regularly & by following a vigorous antireflux regimen for her GERD/LPR; continue same rx 10/17/15>   Theresa Terry continues to do well from the pulm standpoint; she should take her Symbicort more regularly & definitely needs to incr her exercise program; continue antireflux regimen; we discussed the needed adult vaccinations (she continues to refuse vaccines); rec f/u w/ Pulm prn 08/10/16>   Theresa Terry is stable on her current regimen but needs to take the Symbicort160-2spBid regularly; in addition we will provide her w/ ALBUTEROL0.83 for prn use; 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 02/08/17>   IMP/PLAN>>  Theresa Terry remains stable despite her poor compliance w/ medical recommendations- not using Symbicort regularly, but neither has she needed the rescue inhaler OR the NEBS;  She still steadfastly refuses all vaccinations;  We plan ROV recheck in 31mo   Plan:     Patient's Medications  New Prescriptions   No medications on file  Previous Medications   ALBUTEROL (PROVENTIL HFA;VENTOLIN HFA) 108 (90 BASE) MCG/ACT INHALER    Inhale 1-2 puffs into the lungs every 6 (six) hours as needed for wheezing.   ALBUTEROL (PROVENTIL) (2.5 MG/3ML) 0.083% NEBULIZER SOLUTION    Take 3 mLs (2.5 mg total) by nebulization 3 (three) times daily as needed for wheezing or shortness of breath.   BUDESONIDE-FORMOTEROL (SYMBICORT) 160-4.5 MCG/ACT INHALER     Inhale 2 puffs into the lungs 2 (two) times daily.   ESOMEPRAZOLE (NEXIUM) 20 MG CAPSULE    Take 1 capsule (20 mg total) by mouth daily at 12 noon.   FLUTICASONE (FLONASE) 50 MCG/ACT NASAL SPRAY    Place 2 sprays into both nostrils daily.   LORATADINE (CLARITIN) 10 MG TABLET    Take 10 mg by mouth daily.   TRIAMTERENE-HYDROCHLOROTHIAZIDE (MAXZIDE-25) 37.5-25 MG TABLET    Take 1 tablet by mouth daily.  Modified Medications   No medications on file  Discontinued Medications   BUDESONIDE-FORMOTEROL (SYMBICORT) 160-4.5 MCG/ACT INHALER    Inhale 2 puffs into the lungs 2 (two) times daily.

## 2017-02-08 NOTE — Patient Instructions (Signed)
Today we updated your med list in our EPIC system...    Continue your current medications the same...  Today we checked your calcium level anf parathyroid hormone level again...    We will contact you w/ the results when available...   We discussed the indicated PNEUMONIA vaccines:  Prevnar-13 & Pneumovax-23    All adults over 68 y/o should get one of each (we like to separate them out by [id number])...    Check w/ your insurance carrier about coverage (they should cover BOTH)    You may get these shots at any time...  Call for any questions...  Let's plan a follow up visit in 67mo, sooner if needed for problems.Marland Kitchen..Marland Kitchen

## 2017-02-09 LAB — PTH, INTACT AND CALCIUM
Calcium: 10.2 mg/dL (ref 8.6–10.4)
PTH: 63 pg/mL (ref 14–64)

## 2017-03-01 IMAGING — CR DG KNEE COMPLETE 4+V*L*
4 series · 4 of 4 positions shown · non-contrast
Comparison: None.

CLINICAL DATA: Left knee pain

EXAM:
LEFT KNEE - COMPLETE 4+ VIEW

[AP]
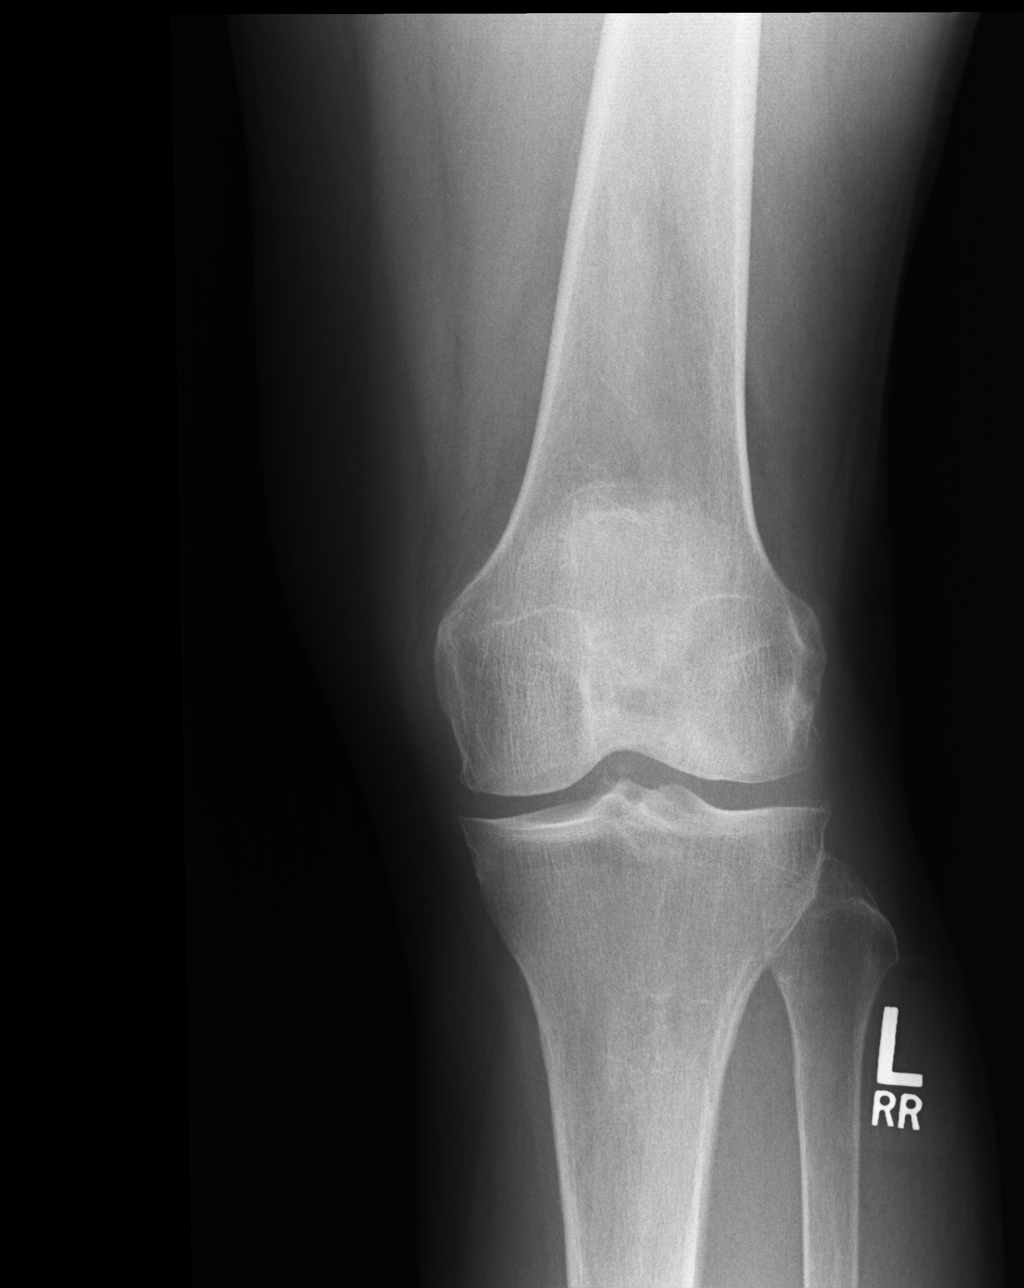

[lateral]
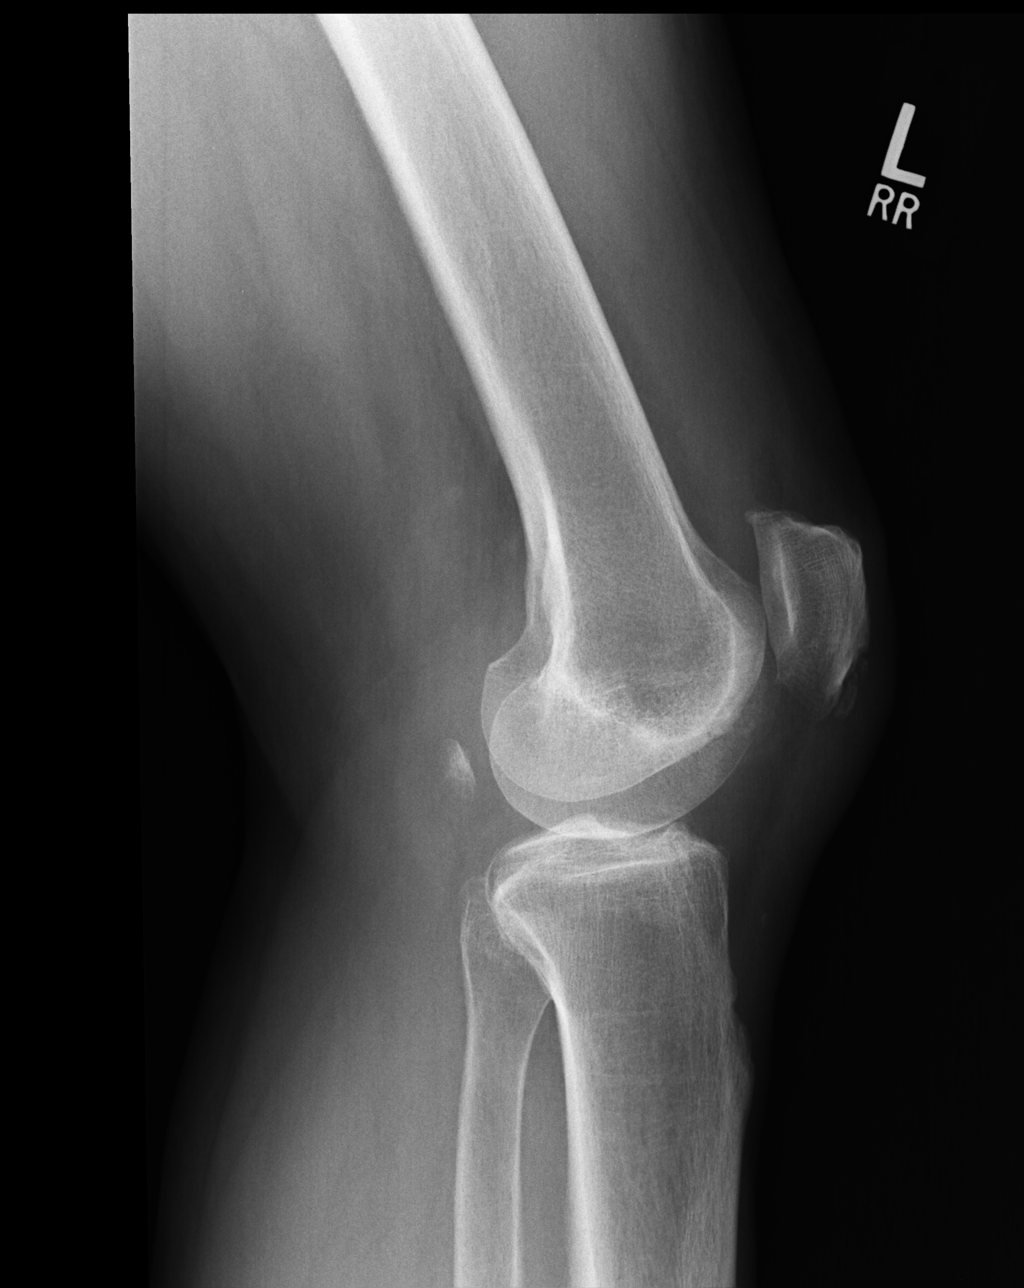

[pa axial]
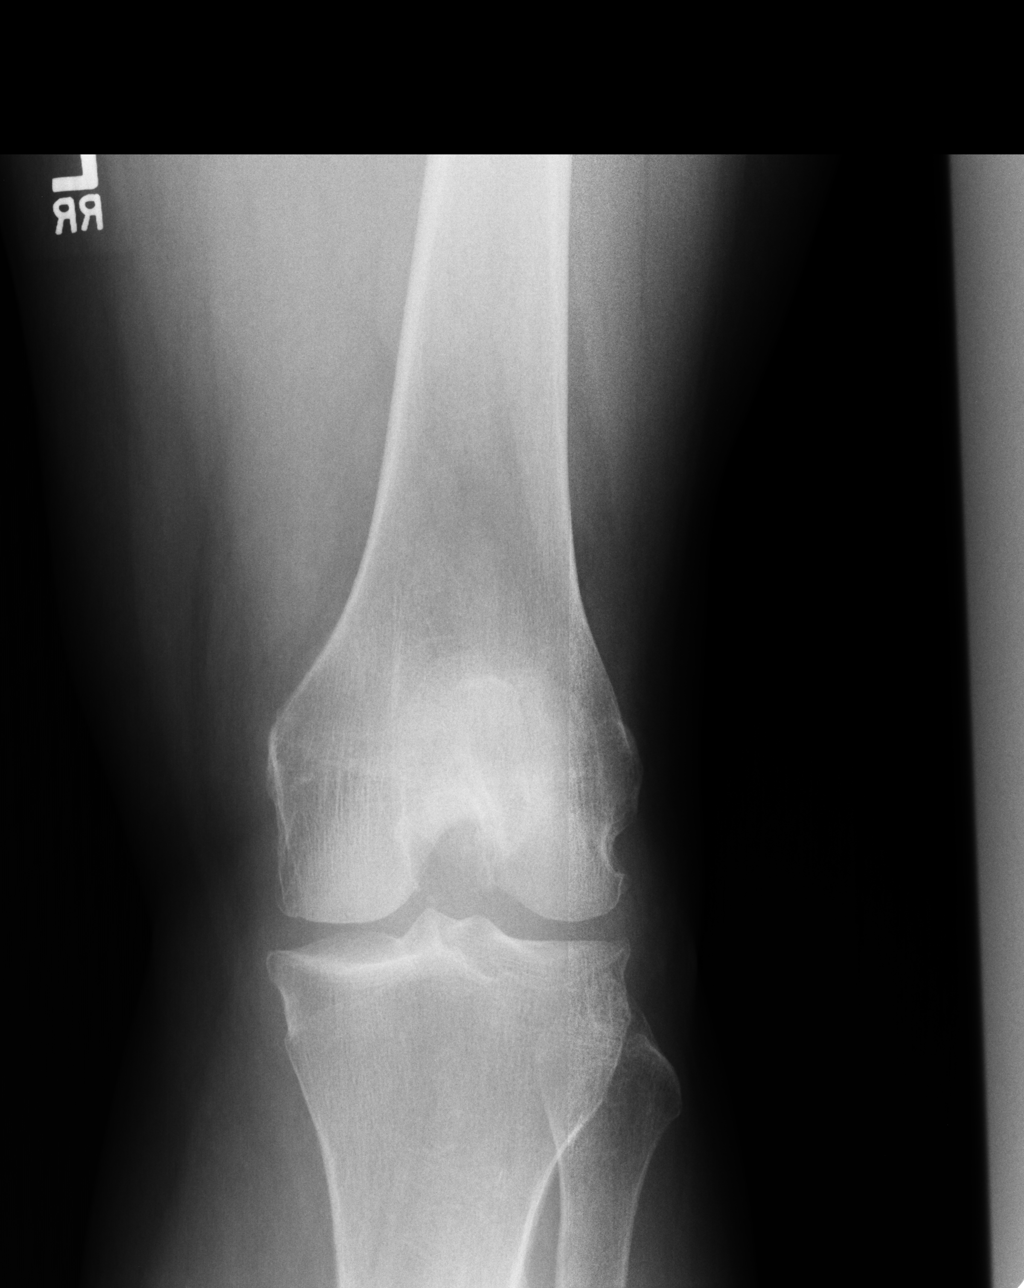

[sunrise]
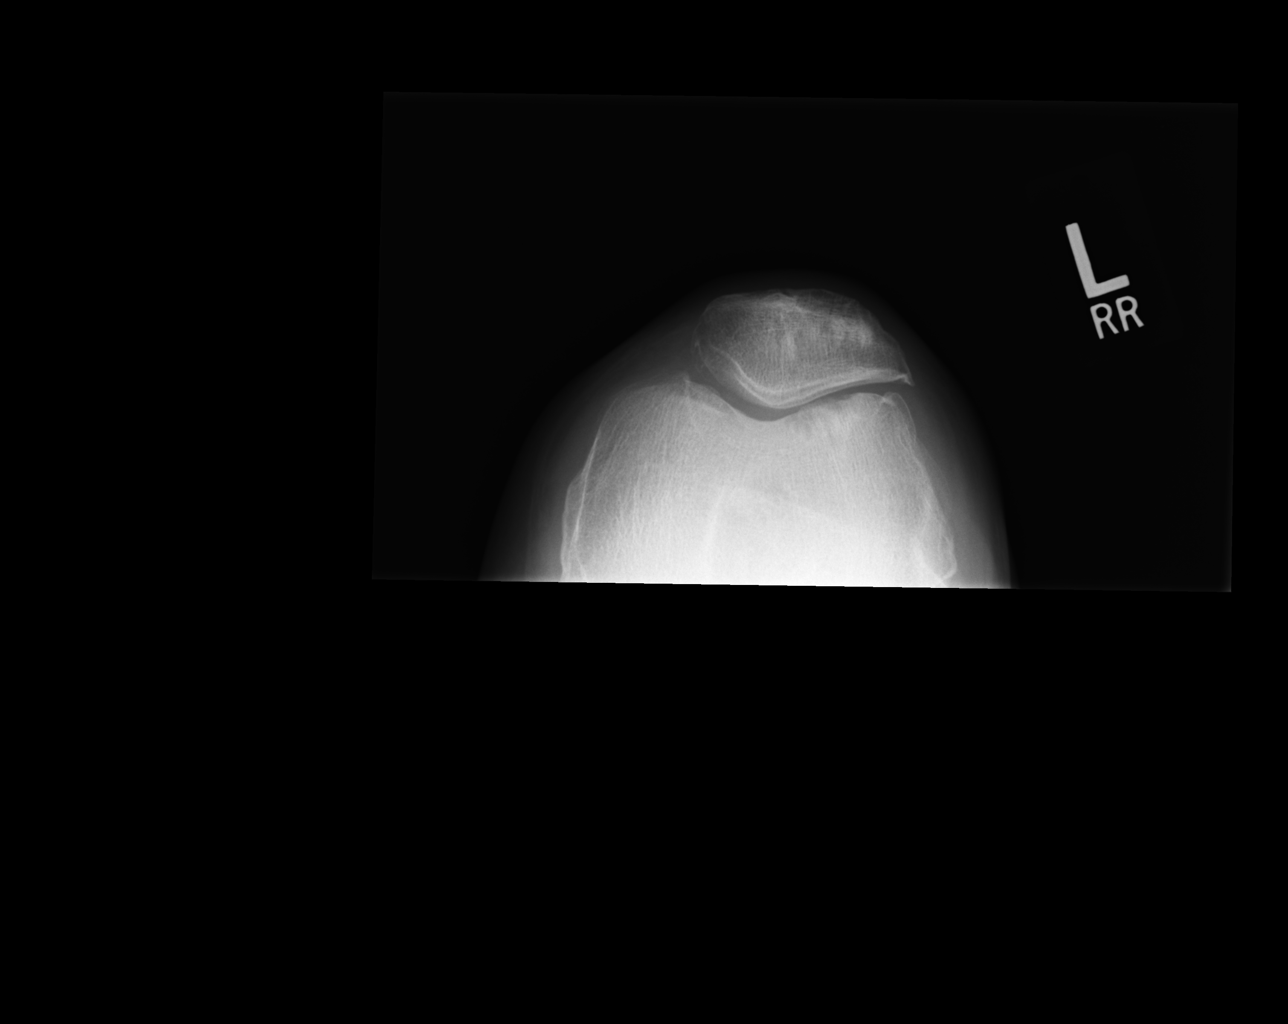

[4 of 4 positions shown; findings below may reference images not displayed]

FINDINGS: Normal alignment no fracture. Possible small effusion. Mild
degenerative change and spurring of the patella. Medial lateral
joint spaces are intact. Fabella noted.
IMPRESSION: Minimal degenerative change at the patella with a small joint
effusion.

## 2017-03-08 DIAGNOSIS — H5213 Myopia, bilateral: Secondary | ICD-10-CM | POA: Diagnosis not present

## 2017-03-08 DIAGNOSIS — H40013 Open angle with borderline findings, low risk, bilateral: Secondary | ICD-10-CM | POA: Diagnosis not present

## 2017-06-30 IMAGING — CR DG CHEST 2V
2 series · 2 of 2 positions shown · non-contrast
Comparison: 03/22/2014

CLINICAL DATA: Wheezing, cough

EXAM:
CHEST  2 VIEW

[PA]
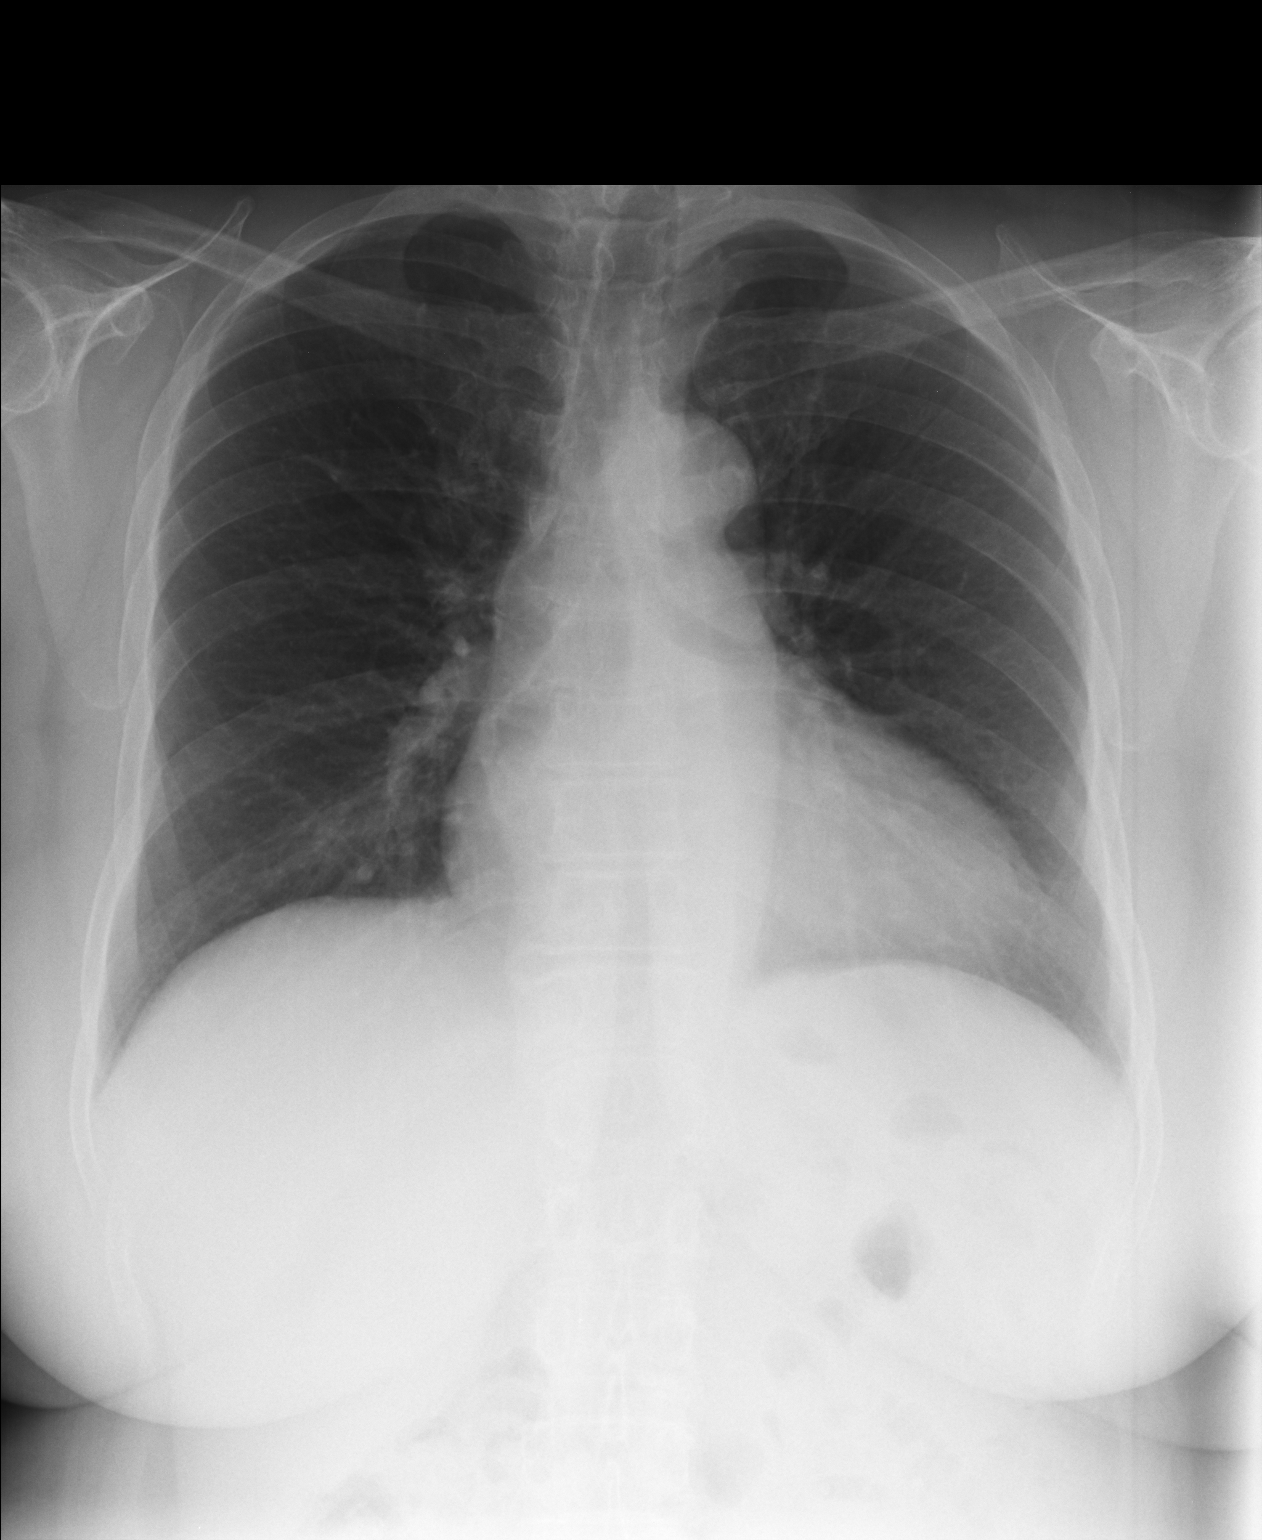

[lateral]
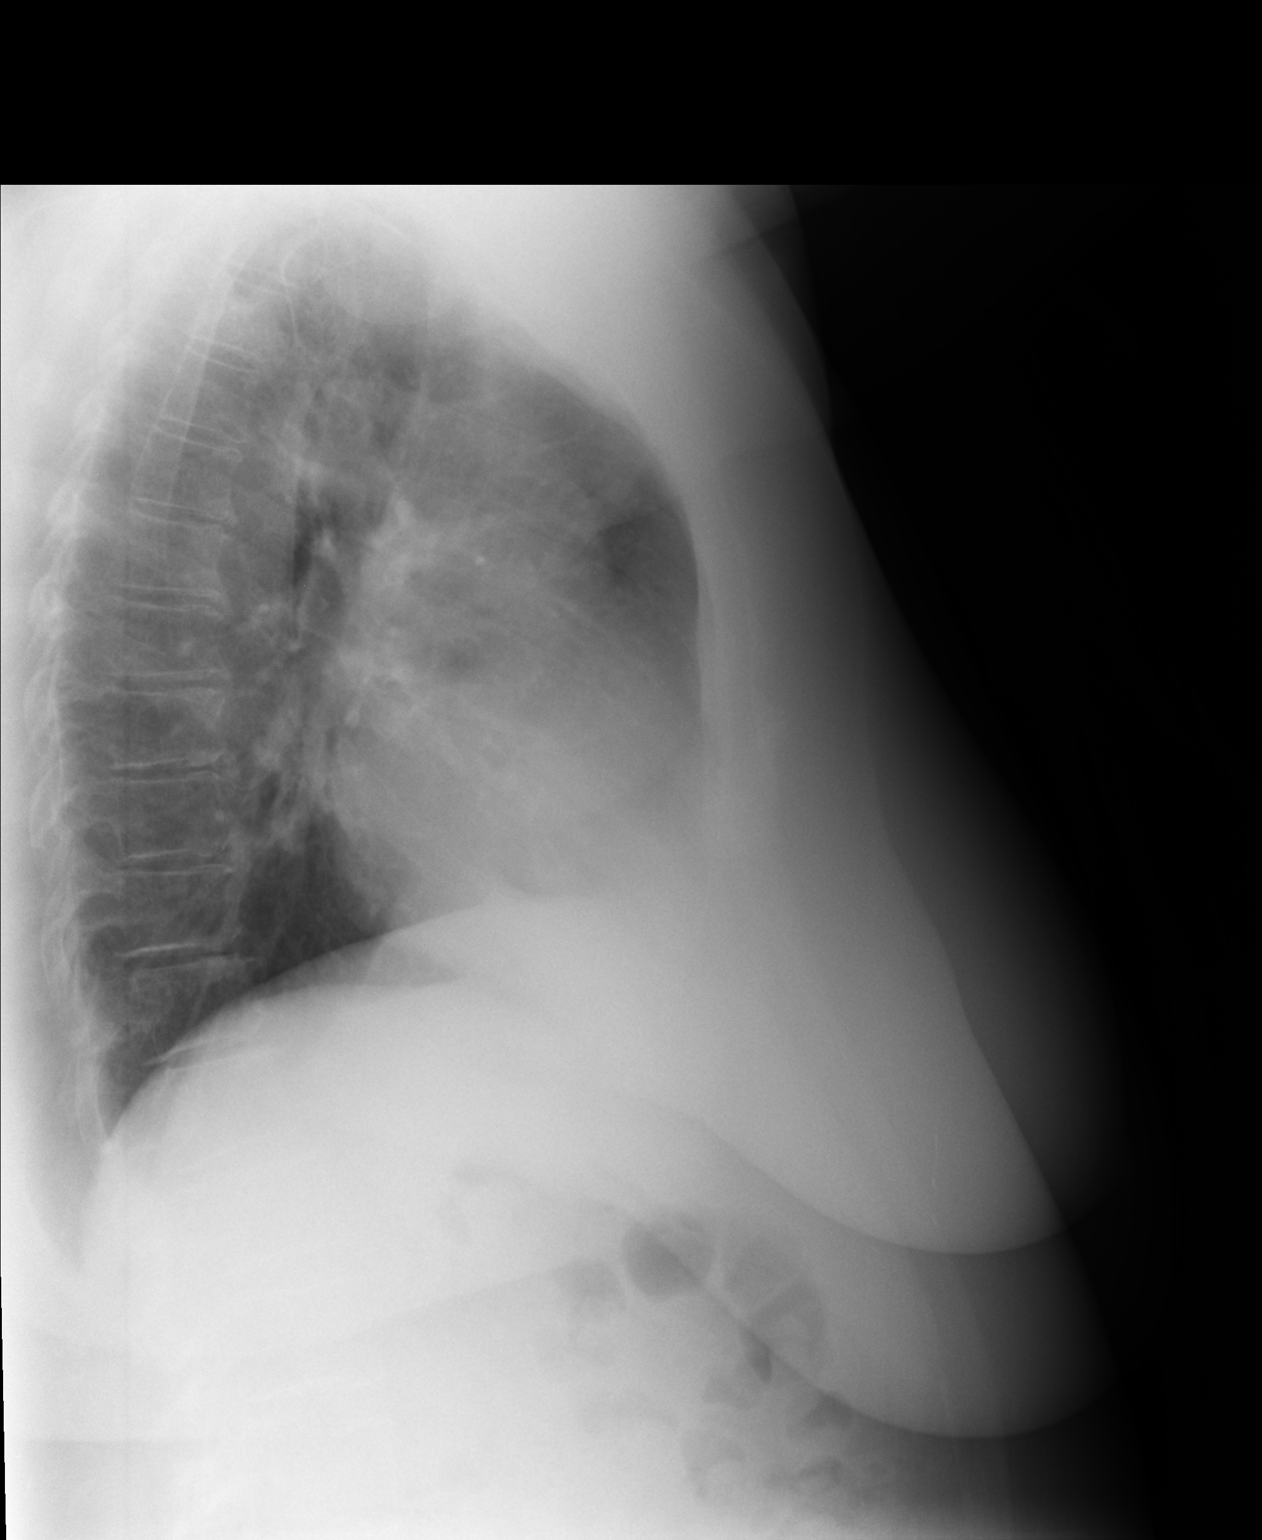

[2 of 2 positions shown; findings below may reference images not displayed]

FINDINGS: Mild cardiomegaly without CHF or focal pneumonia. Lungs remain
clear. No collapse, consolidation, edema, effusion or pneumothorax.
Trachea is midline. Normal bowel gas pattern. Minor aortic
tortuosity. No significant interval change.
IMPRESSION: Mild cardiomegaly without acute process.

## 2017-08-11 ENCOUNTER — Ambulatory Visit (INDEPENDENT_AMBULATORY_CARE_PROVIDER_SITE_OTHER): Payer: Medicare HMO | Admitting: Pulmonary Disease

## 2017-08-11 ENCOUNTER — Other Ambulatory Visit (INDEPENDENT_AMBULATORY_CARE_PROVIDER_SITE_OTHER): Payer: Medicare HMO

## 2017-08-11 ENCOUNTER — Encounter: Payer: Self-pay | Admitting: Pulmonary Disease

## 2017-08-11 VITALS — BP 140/92 | HR 88 | Ht 62.0 in | Wt 178.6 lb

## 2017-08-11 DIAGNOSIS — I10 Essential (primary) hypertension: Secondary | ICD-10-CM

## 2017-08-11 DIAGNOSIS — K219 Gastro-esophageal reflux disease without esophagitis: Secondary | ICD-10-CM

## 2017-08-11 DIAGNOSIS — R062 Wheezing: Secondary | ICD-10-CM | POA: Diagnosis not present

## 2017-08-11 DIAGNOSIS — J683 Other acute and subacute respiratory conditions due to chemicals, gases, fumes and vapors: Secondary | ICD-10-CM

## 2017-08-11 DIAGNOSIS — E663 Overweight: Secondary | ICD-10-CM | POA: Diagnosis not present

## 2017-08-11 DIAGNOSIS — J309 Allergic rhinitis, unspecified: Secondary | ICD-10-CM

## 2017-08-11 DIAGNOSIS — F411 Generalized anxiety disorder: Secondary | ICD-10-CM

## 2017-08-11 DIAGNOSIS — E213 Hyperparathyroidism, unspecified: Secondary | ICD-10-CM | POA: Diagnosis not present

## 2017-08-11 LAB — COMPREHENSIVE METABOLIC PANEL
ALBUMIN: 4.3 g/dL (ref 3.5–5.2)
ALK PHOS: 94 U/L (ref 39–117)
ALT: 18 U/L (ref 0–35)
AST: 17 U/L (ref 0–37)
BUN: 19 mg/dL (ref 6–23)
CHLORIDE: 102 meq/L (ref 96–112)
CO2: 29 mEq/L (ref 19–32)
Calcium: 11.2 mg/dL — ABNORMAL HIGH (ref 8.4–10.5)
Creatinine, Ser: 0.93 mg/dL (ref 0.40–1.20)
GFR: 76.95 mL/min (ref >60.00)
Glucose, Bld: 103 mg/dL — ABNORMAL HIGH (ref 70–99)
POTASSIUM: 4.1 meq/L (ref 3.5–5.1)
Sodium: 139 mEq/L (ref 135–145)
TOTAL PROTEIN: 7.6 g/dL (ref 6.0–8.3)
Total Bilirubin: 0.6 mg/dL (ref 0.2–1.2)

## 2017-08-11 LAB — CBC WITH DIFFERENTIAL/PLATELET
BASOS PCT: 0.9 % (ref 0.0–3.0)
Basophils Absolute: 0.1 10*3/uL (ref 0.0–0.1)
EOS PCT: 1.6 % (ref 0.0–5.0)
Eosinophils Absolute: 0.1 10*3/uL (ref 0.0–0.7)
HEMATOCRIT: 47.2 % — AB (ref 36.0–46.0)
HEMOGLOBIN: 15.8 g/dL — AB (ref 12.0–15.0)
LYMPHS PCT: 21.5 % (ref 12.0–46.0)
Lymphs Abs: 1.8 10*3/uL (ref 0.7–4.0)
MCHC: 33.4 g/dL (ref 30.0–36.0)
MCV: 92.3 fl (ref 78.0–100.0)
MONOS PCT: 8.6 % (ref 3.0–12.0)
Monocytes Absolute: 0.7 10*3/uL (ref 0.1–1.0)
NEUTROS ABS: 5.7 10*3/uL (ref 1.4–7.7)
Neutrophils Relative %: 67.4 % (ref 43.0–77.0)
PLATELETS: 311 10*3/uL (ref 150.0–400.0)
RBC: 5.12 Mil/uL — ABNORMAL HIGH (ref 3.87–5.11)
RDW: 13.7 % (ref 11.5–15.5)
WBC: 8.5 10*3/uL (ref 4.0–10.5)

## 2017-08-11 LAB — TSH: TSH: 1.27 u[IU]/mL (ref 0.35–4.50)

## 2017-08-11 MED ORDER — BUDESONIDE-FORMOTEROL FUMARATE 160-4.5 MCG/ACT IN AERO: 2.0000 | INHALATION_SPRAY | Freq: Two times a day (BID) | RESPIRATORY_TRACT | 12 refills | Status: DC

## 2017-08-11 MED ORDER — ALBUTEROL SULFATE HFA 108 (90 BASE) MCG/ACT IN AERS: 1.0000 | INHALATION_SPRAY | Freq: Four times a day (QID) | RESPIRATORY_TRACT | 11 refills | Status: DC | PRN

## 2017-08-11 NOTE — Patient Instructions (Signed)
Today we updated your med list in our EPIC system...    Continue your current medications the same...    We refilled your meds per request...  Today we rechecked your yearly blood work...    We will contact you w/ the results when available...   We will honor your decision NOT to have any of the recommended adult vaccines (Flu shot, pneumonia vaccines, etc).  Keep up the good work w/ DIET & EXERCISE...    Work on weight reduction...  At your convenience-- contact DrCrawford's office downstairs at [phone] for your annual medicare wellness visit.  Call for any questions...  Let's plan a follow up visit in 36mo, sooner if needed for problems.Marland Kitchen..Marland Kitchen

## 2017-08-12 ENCOUNTER — Other Ambulatory Visit: Payer: Self-pay | Admitting: Internal Medicine

## 2017-08-15 ENCOUNTER — Other Ambulatory Visit: Payer: Self-pay | Admitting: Pulmonary Disease

## 2017-08-24 LAB — VITAMIN D 1,25 DIHYDROXY
VITAMIN D 1, 25 (OH) TOTAL: 93 pg/mL — AB (ref 18–72)
Vitamin D2 1, 25 (OH)2: <8 pg/mL
Vitamin D3 1, 25 (OH)2: 93 pg/mL

## 2017-08-24 LAB — PARATHYROID HORMONE, INTACT (NO CA): PTH: 48 pg/mL (ref 14–64)

## 2017-08-25 NOTE — Progress Notes (Signed)
Was able to talk to the patient regarding their results.  They verbalized an understanding of what was discussed. No further questions at this time.  She verbalized, she will no longer take her Vit D. And wasn't take any calcium.

## 2017-09-28 ENCOUNTER — Ambulatory Visit: Payer: Medicare HMO

## 2017-10-11 ENCOUNTER — Ambulatory Visit (INDEPENDENT_AMBULATORY_CARE_PROVIDER_SITE_OTHER): Payer: Medicare HMO | Admitting: *Deleted

## 2017-10-11 VITALS — BP 148/84 | HR 73 | Resp 18 | Ht 62.0 in | Wt 176.0 lb

## 2017-10-11 DIAGNOSIS — Z0000 Encounter for general adult medical examination without abnormal findings: Secondary | ICD-10-CM

## 2017-10-11 DIAGNOSIS — Z1231 Encounter for screening mammogram for malignant neoplasm of breast: Secondary | ICD-10-CM

## 2017-10-11 DIAGNOSIS — E2839 Other primary ovarian failure: Secondary | ICD-10-CM

## 2017-10-11 NOTE — Progress Notes (Signed)
Medical screening examination/treatment/procedure(s) were performed by non-physician practitioner and as supervising physician I was immediately available for consultation/collaboration. I agree with above. Hari Casaus A Demere Dotzler, MD 

## 2017-10-11 NOTE — Patient Instructions (Addendum)
Continue doing brain stimulating activities (puzzles, reading, adult coloring books, staying active) to keep memory sharp.   Continue to eat heart healthy diet (full of fruits, vegetables, whole grains, lean protein, water--limit salt, fat, and sugar intake) and increase physical activity as tolerated.   Theresa Terry , Thank you for taking time to come for your Medicare Wellness Visit. I appreciate your ongoing commitment to your health goals. Please review the following plan we discussed and let me know if I can assist you in the future.   These are the goals we discussed: Goals    . Patient Stated     Lose weight by doing workout tapes, walking, chair exercises, and dancing to good music. Eat healthy, use portion control, and monitor my diet for salt, fat, and sugar.        This is a list of the screening recommended for you and due dates:  Health Maintenance  Topic Date Due  . Tetanus Vaccine  12/09/1967  . Colon Cancer Screening  12/09/1998  . Pneumonia vaccines (1 of 2 - PCV13) 12/08/2013  . Mammogram  02/08/2016  . Flu Shot  01/27/2018  .  Hepatitis C: One time screening is recommended by Center for Disease Control  (CDC) for  adults born from [id number] through 1967.   Completed  . DEXA scan (bone density measurement)  Addressed

## 2017-10-11 NOTE — Progress Notes (Signed)
**Note Theresa-Identified via Obfuscation** Subjective:   Theresa Terry is a 67 y.o. female who presents for Medicare Annual (Subsequent) preventive examination.  Review of Systems:  No ROS.  Medicare Wellness Visit. Additional risk factors are reflected in the social history.  Cardiac Risk Factors include: advanced age (>1555men, 68>65 women);dyslipidemia;hypertension Sleep patterns: gets up 1 times nightly to void and sleeps 6 hours nightly. Patient reports insomnia issues, discussed recommended sleep tips.   Home Safety/Smoke Alarms: Feels safe in home. Smoke alarms in place.  Living environment; residence and Firearm Safety: 1-story house/ trailer, no firearms.Lives alone, no needs for DME, good support system Seat Belt Safety/Bike Helmet: Wears seat belt.     Objective:     Vitals: BP (!) 148/84   Pulse 73   Resp 18   Ht 5\' 2"  (1.575 m)   Wt 176 lb (79.8 kg)   SpO2 99%   BMI 32.19 kg/m   Body mass index is 32.19 kg/m.  Advanced Directives 10/11/2017  Does Patient Have a Medical Advance Directive? No  Would patient like information on creating a medical advance directive? Yes (ED - Information included in AVS)    Tobacco Social History   Tobacco Use  Smoking Status Passive Smoke Exposure - Never Smoker  Smokeless Tobacco Never Used     Counseling given: Not Answered  Past Medical History:  Diagnosis Date  . Allergy   . Arthritis   . Asthma   . Cataract   . GERD (gastroesophageal reflux disease)   . Hypertension    Past Surgical History:  Procedure Laterality Date  . ABDOMINAL HYSTERECTOMY    . TONSILLECTOMY AND ADENOIDECTOMY  1961   Family History  Problem Relation Age of Onset  . Hypertension Mother   . Heart failure Mother   . Alcohol abuse Mother   . Heart disease Mother   . Alcohol abuse Father   . Heart disease Father   . Alcohol abuse Brother   . Heart disease Maternal Aunt   . Heart disease Maternal Uncle    Social History   Socioeconomic History  . Marital status: Divorced   Spouse name: Not on file  . Number of children: 4  . Years of education: Not on file  . Highest education level: Not on file  Occupational History  . Not on file  Social Needs  . Financial resource strain: Not hard at all  . Food insecurity:    Worry: Never true    Inability: Never true  . Transportation needs:    Medical: No    Non-medical: No  Tobacco Use  . Smoking status: Passive Smoke Exposure - Never Smoker  . Smokeless tobacco: Never Used  Substance and Sexual Activity  . Alcohol use: Yes    Alcohol/week: 0.0 oz  . Drug use: No  . Sexual activity: Not Currently  Lifestyle  . Physical activity:    Days per week: 0 days    Minutes per session: 0 min  . Stress: Not at all  Relationships  . Social connections:    Talks on phone: More than three times a week    Gets together: More than three times a week    Attends religious service: More than 4 times per year    Active member of club or organization: Yes    Attends meetings of clubs or organizations: More than 4 times per year    Relationship status: Divorced  Other Topics Concern  . Not on file  Social History Narrative  .  Not on file    Outpatient Encounter Medications as of 10/11/2017  Medication Sig  . albuterol (PROVENTIL HFA;VENTOLIN HFA) 108 (90 Base) MCG/ACT inhaler Inhale 1-2 puffs into the lungs every 6 (six) hours as needed for wheezing.  Marland Kitchen albuterol (PROVENTIL) (2.5 MG/3ML) 0.083% nebulizer solution Take 3 mLs (2.5 mg total) by nebulization 3 (three) times daily as needed for wheezing or shortness of breath.  . budesonide-formoterol (SYMBICORT) 160-4.5 MCG/ACT inhaler Inhale 2 puffs into the lungs 2 (two) times daily.  Marland Kitchen esomeprazole (NEXIUM) 20 MG capsule Take 1 capsule (20 mg total) by mouth daily at 12 noon.  . fluticasone (FLONASE) 50 MCG/ACT nasal spray Place 2 sprays into both nostrils daily. Need annual appointment for further refills  . ibuprofen (ADVIL,MOTRIN) 200 MG tablet Take 200 mg by mouth  every 6 (six) hours as needed.  . loratadine (CLARITIN) 10 MG tablet Take 10 mg by mouth daily.  . Multiple Vitamin (MULTIVITAMIN) tablet Take 1 tablet by mouth daily.  Marland Kitchen triamterene-hydrochlorothiazide (MAXZIDE-25) 37.5-25 MG tablet Take 1 tablet by mouth daily.  . [DISCONTINUED] fluticasone (FLONASE) 50 MCG/ACT nasal spray INSTILL 2 SPRAY(S) IN EACH NOSTRIL ONCE DAILY   No facility-administered encounter medications on file as of 10/11/2017.     Activities of Daily Living In your present state of health, do you have any difficulty performing the following activities: 10/11/2017  Hearing? N  Vision? N  Difficulty concentrating or making decisions? N  Walking or climbing stairs? N  Dressing or bathing? N  Doing errands, shopping? N  Preparing Food and eating ? N  Using the Toilet? N  In the past six months, have you accidently leaked urine? N  Do you have problems with loss of bowel control? N  Managing your Medications? N  Managing your Finances? N  Housekeeping or managing your Housekeeping? N  Some recent data might be hidden    Patient Care Team: Myrlene Broker, MD as PCP - General (Internal Medicine)    Assessment:   This is a routine wellness examination for [place]. Physical assessment deferred to PCP.   Exercise Activities and Dietary recommendations Current Exercise Habits: The patient does not participate in regular exercise at present, Exercise limited by: None identified  Diet (meal preparation, eat out, water intake, caffeinated beverages, dairy products, fruits and vegetables): in general, a "healthy" diet  , well balanced, eats a variety of fruits and vegetables daily, limits salt, fat/cholesterol, sugar,carbohydrates,caffeine, drinks 5-6 glasses of water daily.  Reviewed heart healthy diet, encouraged patient to increase daily water intake. Discussed weight loss strategies, Diet education was provided via handout.  Goals    . Patient Stated     Lose  weight by doing workout tapes, walking, chair exercises, and dancing to good music. Eat healthy, use portion control, and monitor my diet for salt, fat, and sugar.        Fall Risk Fall Risk  10/11/2017 12/24/2014  Falls in the past year? No No    Depression Screen PHQ 2/9 Scores 10/11/2017 02/24/2015 12/24/2014  PHQ - 2 Score 0 0 0  PHQ- 9 Score 0 - -     Cognitive Function       Ad8 score reviewed for issues:  Issues making decisions: no  Less interest in hobbies / activities: no  Repeats questions, stories (family complaining): no  Trouble using ordinary gadgets (microwave, computer, phone):no  Forgets the month or year: no  Mismanaging finances: no  Remembering appts: no  Daily problems with  thinking and/or memory: no Ad8 score is= 0   There is no immunization history on file for this patient.  Screening Tests Health Maintenance  Topic Date Due  . TETANUS/TDAP  12/09/1967  . MAMMOGRAM  02/08/2016  . COLONOSCOPY  10/12/2018 (Originally 12/09/1998)  . PNA vac Low Risk Adult (1 of 2 - PCV13) 10/12/2018 (Originally 12/08/2013)  . INFLUENZA VACCINE  01/27/2018  . Hepatitis C Screening  Completed  . DEXA SCAN  Addressed      Plan:   Patient declined colonoscopy screening. An order was placed for cologuard, mammogram and bone density screening per patient's request.   Continue doing brain stimulating activities (puzzles, reading, adult coloring books, staying active) to keep memory sharp.   Continue to eat heart healthy diet (full of fruits, vegetables, whole grains, lean protein, water--limit salt, fat, and sugar intake) and increase physical activity as tolerated.  I have personally reviewed and noted the following in the patient's chart:   . Medical and social history . Use of alcohol, tobacco or illicit drugs  . Current medications and supplements . Functional ability and status . Nutritional status . Physical activity . Advanced directives . List of  other physicians . Vitals . Screenings to include cognitive, depression, and falls . Referrals and appointments  In addition, I have reviewed and discussed with patient certain preventive protocols, quality metrics, and best practice recommendations. A written personalized care plan for preventive services as well as general preventive health recommendations were provided to patient.     Wanda Plump, RN  10/11/2017

## 2017-11-11 ENCOUNTER — Other Ambulatory Visit: Payer: Self-pay | Admitting: Internal Medicine

## 2017-11-11 DIAGNOSIS — E2839 Other primary ovarian failure: Secondary | ICD-10-CM

## 2017-11-12 ENCOUNTER — Other Ambulatory Visit: Payer: Medicare HMO

## 2017-11-12 ENCOUNTER — Ambulatory Visit
Admission: RE | Admit: 2017-11-12 | Discharge: 2017-11-12 | Disposition: A | Discharge status: Home / Self Care | Payer: Medicare HMO | Source: Ambulatory Visit | Attending: Internal Medicine | Admitting: Internal Medicine

## 2017-11-12 DIAGNOSIS — Z1231 Encounter for screening mammogram for malignant neoplasm of breast: Secondary | ICD-10-CM | POA: Diagnosis not present

## 2017-11-12 DIAGNOSIS — E2839 Other primary ovarian failure: Secondary | ICD-10-CM

## 2017-11-12 DIAGNOSIS — M810 Age-related osteoporosis without current pathological fracture: Secondary | ICD-10-CM | POA: Diagnosis not present

## 2017-11-12 DIAGNOSIS — M85852 Other specified disorders of bone density and structure, left thigh: Secondary | ICD-10-CM | POA: Diagnosis not present

## 2017-11-12 DIAGNOSIS — Z780 Asymptomatic menopausal state: Secondary | ICD-10-CM | POA: Diagnosis not present

## 2017-11-15 ENCOUNTER — Telehealth: Payer: Self-pay | Admitting: Internal Medicine

## 2017-11-15 MED ORDER — ALENDRONATE SODIUM 70 MG PO TABS: 70.0000 mg | ORAL_TABLET | ORAL | 3 refills | Status: DC

## 2017-11-15 NOTE — Telephone Encounter (Signed)
LVM for patient to call back if she has not yet been called back by who she was talking to when the phone got cut off

## 2017-11-15 NOTE — Telephone Encounter (Signed)
Copied from CRM [id number]. Topic: Quick Communication - See Telephone Encounter >> Nov 15, 2017 10:08 AM Landry Mellow wrote: CRM for notification. See Telephone encounter for: 11/15/17.  Pt had bone density on Friday and would like to have call back about treatment.  She would also like of you to call in whatever pt needs to be taking - pt declined to schedule appt  Cb is [phone]

## 2017-11-15 NOTE — Telephone Encounter (Signed)
Sent in fosamax which is an oral medicine to be taken weekly. She should take on an empty stomach with full glass of water and remain upright (do not lie down) for 30 minutes afterwards. This will help to build more bone to strengthen her bones. She should have repeat bone density in about 2 years to make sure this is helping.

## 2017-11-15 NOTE — Telephone Encounter (Signed)
Patient called and informed of the note by Dr. Okey Dupre on 11/15/17, patient verbalized understanding.

## 2017-11-15 NOTE — Telephone Encounter (Signed)
Patient wanting to know what the oral treatment is

## 2017-11-15 NOTE — Telephone Encounter (Signed)
Pt. Was speaking to someone at office.  Accidentally got cut off and called right back.    Please call her back

## 2018-01-12 ENCOUNTER — Encounter: Payer: Self-pay | Admitting: Internal Medicine

## 2018-01-12 ENCOUNTER — Ambulatory Visit (INDEPENDENT_AMBULATORY_CARE_PROVIDER_SITE_OTHER): Payer: Medicare HMO | Admitting: Internal Medicine

## 2018-01-12 VITALS — BP 152/90 | HR 78 | Temp 98.0°F | Ht 62.0 in | Wt 180.0 lb

## 2018-01-12 DIAGNOSIS — I10 Essential (primary) hypertension: Secondary | ICD-10-CM | POA: Diagnosis not present

## 2018-01-12 DIAGNOSIS — H60391 Other infective otitis externa, right ear: Secondary | ICD-10-CM

## 2018-01-12 MED ORDER — NEOMYCIN-POLYMYXIN-HC 3.5-10000-1 OT SUSP: 3.0000 [drp] | Freq: Three times a day (TID) | OTIC | 0 refills | Status: DC

## 2018-01-12 MED ORDER — AMLODIPINE BESYLATE 5 MG PO TABS: 5.0000 mg | ORAL_TABLET | Freq: Every day | ORAL | 0 refills | Status: DC

## 2018-01-12 NOTE — Progress Notes (Signed)
**Note Theresa-Identified via Obfuscation**    Subjective:    Patient ID: Theresa Terry, female    DOB: Sep 19, 1950, 67 y.o.   MRN: [id number]  HPI The patient is a 67 YO female coming in for right ear pain. Started about 4 ago. She has tried ibuprofen for the pain which is helping. Overall it is stable since onset. She denies sinus pressure or headaches. She denies nasal drainage or sore throat. She denies taking allergy medicine other than her normal claritin and rare flonase. Denies fevers or chills. Denies cough or SOB and not using albuterol lately.   Review of Systems  Constitutional: Negative for activity change, appetite change, chills, fatigue, fever and unexpected weight change.  HENT: Positive for ear pain. Negative for congestion, ear discharge, postnasal drip, rhinorrhea, sinus pressure, sinus pain, sneezing, sore throat, tinnitus, trouble swallowing and voice change.   Eyes: Negative.   Respiratory: Negative for cough, chest tightness, shortness of breath and wheezing.   Cardiovascular: Negative.   Gastrointestinal: Negative.   Musculoskeletal: Negative for myalgias.  Neurological: Negative.  Negative for headaches.      Objective:   Physical Exam  Constitutional: She is oriented to person, place, and time. She appears well-developed and well-nourished.  HENT:  Head: Normocephalic and atraumatic.  Oropharynx with redness and clear drainage, TMs normal bilaterally, otitis externa right ear  Eyes: EOM are normal.  Neck: Normal range of motion. No thyromegaly present.  Cardiovascular: Normal rate and regular rhythm.  Pulmonary/Chest: Effort normal and breath sounds normal. No respiratory distress. She has no wheezes. She has no rales.  Abdominal: Soft.  Musculoskeletal: She exhibits tenderness.  Lymphadenopathy:    She has no cervical adenopathy.  Neurological: She is alert and oriented to person, place, and time.  Skin: Skin is warm and dry.   Vitals:   01/12/18 0804  BP: (!) 152/90  Pulse: 78  Temp: 98 F  (36.7 C)  TempSrc: Oral  SpO2: 96%  Weight: 180 lb (81.6 kg)  Height: 5\' 2"  (1.575 m)      Assessment & Plan:

## 2018-01-12 NOTE — Patient Instructions (Addendum)
We have sent in ear drops for the right ear. Use 3 drops 3 times per day for 3 days to clear the infection.  We have sent in amlodipine to take for blood pressure. Stop taking the current hctz for blood pressure. Amlodipine is 1 pill daily and is not a fluid pill.

## 2018-01-13 ENCOUNTER — Encounter: Payer: Self-pay | Admitting: Internal Medicine

## 2018-01-13 ENCOUNTER — Telehealth: Payer: Self-pay | Admitting: Internal Medicine

## 2018-01-13 DIAGNOSIS — H6091 Unspecified otitis externa, right ear: Secondary | ICD-10-CM | POA: Insufficient documentation

## 2018-01-13 NOTE — Telephone Encounter (Signed)
FYI Patient states that she is going to do some research on the amlodipine and if the side effects are worse than the one she was on she is not going to take the medication.

## 2018-01-13 NOTE — Telephone Encounter (Signed)
Patient is not wanting to take the amlodipine and wants the other medication and states she will take it daily even though it is irritating going to the restroom all the time. States she had a talk with her daughter and is worried being off the HCTZ because she gets fluid build up. She stated she does not want to do a separate fluid pill

## 2018-01-13 NOTE — Assessment & Plan Note (Signed)
She has not been in for some time and should have been out of blood pressure medicine more than 1 year ago but she claims to still have medication. Will change to amlodipine daily as she does not take hctz daily as it makes her urinate. BP is borderline off meds today (did not take prior to visit due to the urination problem).

## 2018-01-13 NOTE — Assessment & Plan Note (Signed)
Rx for cortisporin for the ear. Continue flonase and claritin.

## 2018-01-13 NOTE — Telephone Encounter (Signed)
I would recommend to try it and see what happens. If we need to change later we can.

## 2018-01-13 NOTE — Telephone Encounter (Signed)
We would like her to try the amlodipine. She was not taking hctz daily because of the excessive urination and she needs to take BP medication daily.

## 2018-01-13 NOTE — Telephone Encounter (Signed)
Copied from CRM [phone]. Topic: General - Other >> Jan 13, 2018  7:59 AM Gerrianne ScalePayne, Angela L wrote: Reason for CRM: patient was in the office yesterday and her BP medicine was switch to amLODipine (NORVASC) 5 MG tablet  she would like to go back to the Triammperene/Hctz 37.5/25mg  that Okey DupreCrawford took her off on yesterday because the Norvasc doesn't have a diuretic in the medicine

## 2018-01-20 ENCOUNTER — Telehealth: Payer: Self-pay | Admitting: Internal Medicine

## 2018-01-20 NOTE — Telephone Encounter (Signed)
LVM for patient to make an appointment 

## 2018-01-20 NOTE — Telephone Encounter (Signed)
I would recommend a visit to discuss. She was not taking the hctz properly so I would not recommend going back on it for BP control.

## 2018-01-20 NOTE — Telephone Encounter (Signed)
Copied from CRM [id number]. Topic: Quick Communication - See Telephone Encounter >> Jan 20, 2018  8:54 AM Burchel, Abbi R wrote: CRM for notification. See Telephone encounter for: 01/20/18.  Pt states she does not like the new BP med (amLODipine (NORVASC) 5 MG tablet).  She has had swelling around feet and ankles (not swollen now).  She would like to go back to the diuretic BP Med (HCTZ).  Please send rx for HTCZ that she was taking before.  Pt states that she is not taking the amLODipine (NORVASC) 5 MG tablet currently.   [facility] Pharmacy: [facility] [address] - [place] ([address]), [place] - [address]  [phone] (Phone) [phone] (Fax)   Pt CB: [phone]

## 2018-01-20 NOTE — Telephone Encounter (Signed)
Can you please make patient an appointment to discuss thank you

## 2018-01-21 ENCOUNTER — Other Ambulatory Visit: Payer: Self-pay | Admitting: Internal Medicine

## 2018-02-03 ENCOUNTER — Encounter: Payer: Self-pay | Admitting: Pulmonary Disease

## 2018-02-03 ENCOUNTER — Ambulatory Visit (INDEPENDENT_AMBULATORY_CARE_PROVIDER_SITE_OTHER): Payer: Medicare HMO | Admitting: Pulmonary Disease

## 2018-02-03 VITALS — BP 126/70 | HR 88 | Temp 98.1°F | Ht 62.0 in | Wt 177.6 lb

## 2018-02-03 DIAGNOSIS — K219 Gastro-esophageal reflux disease without esophagitis: Secondary | ICD-10-CM | POA: Diagnosis not present

## 2018-02-03 DIAGNOSIS — F411 Generalized anxiety disorder: Secondary | ICD-10-CM

## 2018-02-03 DIAGNOSIS — J683 Other acute and subacute respiratory conditions due to chemicals, gases, fumes and vapors: Secondary | ICD-10-CM | POA: Diagnosis not present

## 2018-02-03 DIAGNOSIS — I10 Essential (primary) hypertension: Secondary | ICD-10-CM

## 2018-02-03 DIAGNOSIS — E663 Overweight: Secondary | ICD-10-CM | POA: Diagnosis not present

## 2018-02-03 MED ORDER — TRIAMTERENE-HCTZ 37.5-25 MG PO TABS: 1.0000 | ORAL_TABLET | Freq: Every day | ORAL | 3 refills | Status: DC

## 2018-02-03 NOTE — Patient Instructions (Addendum)
Today we updated your med list in our EPIC system...     We refilled your Maxzide BP medication, and removed the Amlodipine from the list...  We discussed the importance of DIET & EXERCISE with weight reduction for your overall health & BP...    Remember>>  NO SALT, and low carbs- no sugars, sweets, pastas, or breads...  Call for any questions or if I can be of service in any way...    It has been a pleasure to have served as your doctor over these many years.Marland Kitchen..Marland Kitchen

## 2018-02-03 NOTE — Progress Notes (Signed)
Subjective:     Patient ID: Theresa Terry, female   DOB: 1950/07/09, 67 y.o.   MRN: [id number]  HPI 67 y/o BF w/ AB/ RADS- primary triggers are URIs, norm PFTs 10/2014, GERD & LPR...   ~  March 07, 2015:  Initial pulmonary consult w/ SN>  43 y/o BF, referred by DrDaub for a pulmonary evaluation due to recurrent asthmatic bronchitis symptoms>       Theresa Terry is a never smoker w/ hx of recurrent upper resp infections that lead to persistent cough, chest congestion, wheezing, and dyspnea;  She notes that the infections are "hard to shake" and the symptoms "come right back when I finish the meds";  She has had OVs monthly since [id number] betw DrKollar, [facility], & DrHopper- over this time frame she has been treated w/ ZPak, Pred, Flovent then Qvar, Ventolin rescue, Claritin, Flonase, Tessalon;  She had similar problem in 2014 w/ several ER visits & she notes that all the meds seem to help but the symptoms come back when she stops them... The main trigger seems to be upper resp infections, BUT she also c/o cough & wheezing at nights & AM cough as well- she checked this out on-line & wonders if it could relate to her prob...       Theresa Terry was prev employed at [employer] in the 1970s; she had a job in Print[employer] & worked a lot in [employer]- while it was very dusty she was not around the raw cotton/ carding room/ etc; she similarly denies any asbestos exposure or other known toxic exposures, currently employed cleaning houses...  FamHx is neg for lung problems...      EXAM reveals Afeb, VSS, O2sat=98% on RA;  HEENT- neg, mallampati1;  Chest- clear w/o w/r/r, no congestion, cough is dry;  Heart- RR w/o m/r/g;  Abd- soft, nontender, neg;  Ext- neg w/o c/c/e...  CXR 02/24/15 showed mild cardiomegaly, clear lungs, NAD and unchanged from old films...   PFT 11/12/14 showed FVC=2.54 (114), FEV1=1.97 (114), %1sec=77, mid-flows are wnl at 101% predicted; TLC & RV were mildly reduced and DLCO was  wnl...   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 w/ her main triggers usually upper resp infections;  This pattern has been repeated over the yrs starting in ?2014 & she usually responds to antibiotics, Pred, inhaled bronchodil & ICS;  The pattern over the last several months is c/w this diagnosis & she finally improved w/ the Pred;  I believe that she would benefit from an ICS/LABA combination inhaler to use regularly & hopefully prevent the freq recurrences that tended to occur when she runs out of meds;  She stopped prev Flovent & never filled the Qvar due to cost ($200);  We checked in Epic & her insurance should cover SYMBICORT160- 2spBid;  She has VENTOLIN rescue inhaler to use 1-2sp Q6H prn;  Her other issue appears to be GERD/ reflux w/ nocturnal & early AM symptoms- we discussed a vigorous antireflux regimen w/ Protonix40, NPO after dinner, elev HOB 6" (see AVS);  She will call w/ questions or problems and we plan recheck in 6 weeks...  ~  April 18, 2015:  6wk ROV w/ SN>  Theresa Terry returns on her Symbicort- 2spBid, Ventolin rescue inhaler (rarely uses), and her vigorous antireflux regimen (Protonix40 before dinner,  NPO after dinner, elev HOB 6")- much improved, resting better, no nighttime cough & improved days; notes min cough (some drainage), no sput, no hemoptysis, denies dyspnea but she is still too sedentary & we discussed the importance of exercise; NOTE> she refuses vaccines...     AR> on Claritin & Flonase doing satis...    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    GERD & prob LPR> improved on Protonix40Qhs, NPO after dinner, elev HOB 6"...    Medical issues>  HBP on Maxzide-25, DJD, Anxiety- followed by DrCrawford...  EXAM reveals Afeb,  VSS, O2sat=98% on RA;  HEENT- neg, mallampati1;  Chest- clear w/o w/r/r, no congestion, cough is dry;  Heart- RR w/o m/r/g;  Abd- soft, nontender, neg;  Ext- neg w/o c/c/e... IMP/PLAN>>  Theresa Terry is improved on her inhalers regularly & by following a vigorous antireflux regimen for her GERD/LPR; continue same rx...  ~  October 17, 2015:  60mo ROV & Theresa Terry reports that she continues to do well, no recurrent AB episodes or refractory attacks; she tells me that she stopped the Symbicort160 on her own & now uses it just prn, and she hasn't needed the rescue inhaler at all; she does continue on the vigorous antireflux regimen (Protonix40 before dinner, NPO after dinner, elev HOB 6") & this seems to have made all the difference for her... we reviewed the following medical problems during today's office visit >>     AR> on Claritin & Flonase doing satis...    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    GERD & prob LPR> improved on Protonix40Qhs, NPO after dinner, elev HOB 6"...    Medical issues>  HBP on Maxzide-25, DJD, Anxiety, she refuses vaccines- followed by DrCrawford... NOTE: prev elev CALCIUM level at 11.1 Apr2016 needs recheck w/ PTH level to r/o primary hyperpara... EXAM reveals Afeb, VSS, O2sat=94% on RA;  HEENT- neg, mallampati1;  Chest- clear w/o w/r/r, no congestion, cough is dry;  Heart- RR w/o m/r/g;  Abd- soft, nontender, neg;  Ext- neg w/o c/c/e IMP/PLAN>>  Theresa Terry continues to do well from the pulm standpoint; she should take her Symbicort more regularly & definitely needs to incr her exercise program; continue antireflux regimen; we discussed the needed adult vaccinations (she continues to refuse vaccines); rec f/u w/ Pulm prn...  ~  August 10, 2016:  10 month ROV & Theresa Terry is a never smoker w/ hx recurrent URIs/ AB/ RADS/  LPR & GERD; she tells me that she bought a nebulizer on her own & wants medication to put in it if needs- we discussed Albuterol 0.83... We reviewed the following medical problems during today's office visit >>     AR> on Claritin & Flonase doing satis...    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    GERD & prob LPR> improved on Nexium20/d, NPO after dinner, elev HOB 6"...    Medical issues>  HBP on Maxzide-25; Hyperparathyroidism w/ elev Ca & PTH, plus  low Phos; DJD; Anxiety; she refuses all vaccines- followed by DrCrawford... EXAM reveals Afeb, VSS, O2sat=100% on RA;  HEENT- neg w/o nodules, mallampati1;  Chest- clear w/o w/r/r, no congestion, cough is dry;  Heart- RR w/o m/r/g;  Abd- soft, nontender, neg;  Ext- neg w/o c/c/e  CXR 08/10/16>  Borderline heart size, clear lungs, NAD...  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  ~  February 08, 2017:  41mo ROV & last visit Laurette purchased a Quarry manager & we wrote for Prn Albuterol for Neb in addition to her Symbicort160-2spBid;  Today she reports doing well & hasn't needed the NEBs in the interval!  AND she is still NOT using the symbicort regularly- eg just twice last week;  She says breathing well, no cough/ sput/ hemoptysis, chr stable DOE when rushing or taking stairs otherw not exercising etc... We did follow up labs today. We reviewed the following medical problems during today's  office visit >>     AR> on Claritin & Flonase doing satis...    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    GERD & prob LPR> improved on Nexium20/d, NPO after dinner, elev HOB 6"...    Medical issues>  PCP is DrCrawford> HBP on Maxzide-25; Hyperparathyroidism w/ elev Ca & PTH & low Phos; DJD; Anxiety; she refuses all vaccines... EXAM reveals Afeb, VSS, O2sat=96% on RA;  HEENT- neg w/o nodules, mallampati1;  Chest- clear w/o w/r/r, no congestion, cough is dry;  Heart- RR w/o m/r/g;  Abd- soft, nontender, neg;  Ext- neg w/o c/c/e  LABS 02/08/17>  Ca=10.2;  PTH level = 63 (upper lim & not progressive IMP/PLAN>>  Darrah remains stable despite her poor compliance w/ medical recommendations- not using Symbicort regularly, but neither has she needed the rescue inhaler OR the NEBS;  She still steadfastly refuses all vaccinations;  We plan ROV recheck in 41mo...    ~  August 11, 2017:  41mo ROV & since last visit Aubrei has continued to do well, no change in DOE w/ rushing or on stairs, and she has continued to use just prn "I take herbal tea" she says;  She has not yet followed up w/ DrCrawford, her PCP (last 11/2015);  She has continued to refuse all vaccinations... We did follow up blood work today... We reviewed the following medical problems during today's office visit >>     AR> on Claritin & Flonase prn & doing satis...    AB/RADS> on Symbicort160-2spBid (but she decr on her own to just prn), Ventolin rescue inhaler vs Home NEB w/ Albuterol; Hx of refractory AB infections w/ RADS prev requiring mult ER visits, improved w/ this Rx & vigorous antireflux regimen...    GERD & prob LPR> improved on Nexium20/d, NPO after dinner, elev HOB 6"...    Medical issues>  PCP is DrCrawford> HBP on Maxzide-25; overweight;  Hyperparathyroidism w/ elev Ca & PTH & low Phos; DJD; Anxiety; she refuses all vaccines... EXAM reveals Afeb, VSS, O2sat=96%  on RA;  HEENT- neg w/o nodules, mallampati1;  Chest- clear w/o w/r/r, no congestion, cough is dry;  Heart- RR w/o m/r/g;  Abd- soft, nontender, neg;  Ext- neg w/o c/c/e  LABS 08/11/17>  Chems- wnl w/ K=4.1, BS=103, Cr=0.93, Ca=11.2;  CBC- wnl w/ Hg=15.8;  TSH=1.27;  Vit D level = 93;  PTH= 48 IMP/PLAN>>  Shantal remains medically stable, still NOT using her inhalers regularly but also not needing her rescue inhalers at all;  She continues to refuse all immunizations despite my most persuasive arguments in favor of vaccination;  Her serum calcium level is elev but her PTH level is now wnl- reminded to avoid all calcium supplements!    Past Medical History  Diagnosis Date  . Hypertension >> on Maxzide-25   . Asthma >> SEE ABOVE   . Arthritis   . GERD (gastroesophageal reflux disease) > adding Nexium240 & antireflux regimen   . Allergy    Hyperparathyroidisn w/ sl elev Ca, reduced Phos, and elev PTH level - on observation   . Cataract     Past Surgical History:  Procedure Laterality Date  . ABDOMINAL HYSTERECTOMY    . TONSILLECTOMY AND ADENOIDECTOMY  1961    Outpatient Encounter Medications as of 08/11/2017  Medication Sig  . albuterol (PROVENTIL HFA;VENTOLIN HFA) 108 (90 Base) MCG/ACT inhaler Inhale 1-2 puffs into the lungs every 6 (six) hours as needed for wheezing.  Marland Kitchen albuterol (PROVENTIL) (2.5 MG/3ML) 0.083% nebulizer solution Take 3 mLs (2.5 mg total) by nebulization 3 (three) times daily as needed for wheezing or shortness of breath.  . budesonide-formoterol (SYMBICORT) 160-4.5 MCG/ACT inhaler Inhale 2 puffs into the lungs 2 (two) times daily.  Marland Kitchen esomeprazole (NEXIUM) 20 MG capsule Take 1 capsule (20 mg total) by mouth daily at 12 noon.  . loratadine (CLARITIN) 10 MG tablet Take 10 mg by mouth daily.  . [DISCONTINUED] albuterol (PROVENTIL HFA;VENTOLIN HFA) 108  (90 Base) MCG/ACT inhaler Inhale 1-2 puffs into the lungs every 6 (six) hours as needed for wheezing.  . [DISCONTINUED] budesonide-formoterol (SYMBICORT) 160-4.5 MCG/ACT inhaler Inhale 2 puffs into the lungs 2 (two) times daily.  . [DISCONTINUED] fluticasone (FLONASE) 50 MCG/ACT nasal spray Place 2 sprays into both nostrils daily.  . [DISCONTINUED] triamterene-hydrochlorothiazide (MAXZIDE-25) 37.5-25 MG tablet Take 1 tablet by mouth daily.   No facility-administered encounter medications on file as of 08/11/2017.     No Known Allergies    There is no immunization history on file for this patient. >> she has refused vaccinations! She needs to be sure that she gets all her needed vaccines & will check w/ her PCP.   Current Medications, Allergies, Past Medical History, Past Surgical History, Family History, and Social History were reviewed in [employer] record.   Review of Systems            All symptoms NEG except where BOLDED >>  Constitutional:  F/C/S, fatigue, anorexia, unexpected weight change. HEENT:  HA, visual changes, hearing loss, earache, nasal symptoms, sore throat, mouth sores, hoarseness. Resp:  cough, sputum, hemoptysis; SOB, tightness, wheezing. Cardio:  CP, palpit, DOE, orthopnea, edema. GI:  N/V/D/C, blood in stool; reflux, abd pain, distention, gas. GU:  dysuria, freq, urgency, hematuria, flank pain, voiding difficulty. MS:  joint pain, swelling, tenderness, decr ROM; neck pain, back pain, etc. Neuro:  HA, tremors, seizures, dizziness, syncope, weakness, numbness, gait abn. Skin:  suspicious lesions or skin rash. Heme:  adenopathy, bruising, bleeding. Psyche:  confusion, agitation, sleep disturbance, hallucinations, anxiety,  depression suicidal.   Objective:   Physical Exam      Vital Signs:  Reviewed...   General:  WD, WN, 67 y/o BF in NAD; alert & oriented; pleasant & cooperative... HEENT:  [place]/AT; Conjunctiva- pink, Sclera- nonicteric,  EOM-wnl, PERRLA, EACs-clear, TMs-wnl; NOSE-clear; THROAT-clear & wnl.  Neck:  Supple w/ fairROM; no JVD; normal carotid impulses w/o bruits; no thyromegaly or nodules palpated; no lymphadenopathy.  Chest:  Clear to P & A; without wheezes, rales, or rhonchi heard. Heart:  Regular Rhythm; norm S1 & S2 without murmurs, rubs, or gallops detected. Abdomen:  Soft & nontender- no guarding or rebound; normal bowel sounds; no organomegaly or masses palpated. Ext:  DecrROM; without deformities +arthritic changes; no varicose veins, venous insuffic, or edema;  Pulses intact w/o bruits. Neuro:  No focal neuro deficits; sensory testing normal; gait normal & balance OK. Derm:  No lesions noted; no rash etc. Lymph:  No cervical, supraclavicular, axillary, or inguinal adenopathy palpated.   Assessment:      IMP >>     AR> on Claritin & Flonase doing satis...    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    GERD & prob LPR> improved on Protonix40Qhs, NPO after dinner, elev HOB 6"...    Medical issues>  HBP on Maxzide-25, Hyperparathyroidism , DJD, Anxiety, she refuses vaccines- followed by DrCrawford...  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 w/ her main triggers usually upper resp infections;  This pattern has been repeated over the yrs starting in ?2014 & she usually responds to antibiotics, Pred, inhaled bronchodil & ICS;  The pattern over the last several months is c/w this diagnosis & she finally improved w/ the Pred;  I believe that she would benefit from an ICS/LABA combination inhaler to use regularly & hopefully prevent the freq recurrences that tended to occur when she runs out of meds;  She stopped prev Flovent & never filled the Qvar due to cost ($200);  We checked in Epic & her insurance should cover SYMBICORT160-  2spBid;  She has VENTOLIN rescue inhaler to use 1-2sp Q6H prn;  Her other issue appears to be GERD/ reflux w/ nocturnal & early AM symptoms- we discussed a vigorous antireflux regimen w/ Protonix40, NPO after dinner, elev HOB 6" (see AVS);  She will call w/ questions or problems and we plan recheck in 6 weeks. 04/18/15>   Theresa Terry is improved on her inhalers regularly & by following a vigorous antireflux regimen for her GERD/LPR; continue same rx 10/17/15>   Theresa Terry continues to do well from the pulm standpoint; she should take her Symbicort more regularly & definitely needs to incr her exercise program; continue antireflux regimen; we discussed the needed adult vaccinations (she continues to refuse vaccines); rec f/u w/ Pulm prn 08/10/16>   Theresa Terry is stable on her current regimen but needs to take the Symbicort160-2spBid regularly; in addition we will provide her w/ ALBUTEROL0.83 for prn use; 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 02/08/17>   Theresa Terry remains stable despite her poor compliance w/ medical recommendations- not using Symbicort regularly, but neither has she needed the rescue inhaler OR the NEBS;  She still steadfastly refuses all vaccinations;  We plan ROV recheck in 64mo 08/11/17>   Theresa Terry remains medically stable, still  NOT using her inhalers regularly but also not needing her rescue inhalers at all;  She continues to refuse all immunizations despite my most persuasive arguments in favor of vaccination;  Her serum calcium level is elev but her PTH level is now wnl- reminded to avoid all calcium supplements!   Plan:     Patient's Medications  New Prescriptions   ALENDRONATE (FOSAMAX) 70 MG TABLET    Take 1 tablet (70 mg total) by mouth every 7 (seven) days. Take with a full glass of water on an empty stomach.   AMLODIPINE (NORVASC) 5 MG TABLET    Take 1 tablet (5 mg total) by mouth daily.   NEOMYCIN-POLYMYXIN-HYDROCORTISONE (CORTISPORIN) 3.5-10000-1  OTIC SUSPENSION    Place 3 drops into the right ear 3 (three) times daily.  Previous Medications   ALBUTEROL (PROVENTIL) (2.5 MG/3ML) 0.083% NEBULIZER SOLUTION    Take 3 mLs (2.5 mg total) by nebulization 3 (three) times daily as needed for wheezing or shortness of breath.   ESOMEPRAZOLE (NEXIUM) 20 MG CAPSULE    Take 1 capsule (20 mg total) by mouth daily at 12 noon.   IBUPROFEN (ADVIL,MOTRIN) 200 MG TABLET    Take 200 mg by mouth every 6 (six) hours as needed.   LORATADINE (CLARITIN) 10 MG TABLET    Take 10 mg by mouth daily.   MULTIPLE VITAMIN (MULTIVITAMIN) TABLET    Take 1 tablet by mouth daily.  Modified Medications   Modified Medication Previous Medication   ALBUTEROL (PROVENTIL HFA;VENTOLIN HFA) 108 (90 BASE) MCG/ACT INHALER albuterol (PROVENTIL HFA;VENTOLIN HFA) 108 (90 Base) MCG/ACT inhaler      Inhale 1-2 puffs into the lungs every 6 (six) hours as needed for wheezing.    Inhale 1-2 puffs into the lungs every 6 (six) hours as needed for wheezing.   BUDESONIDE-FORMOTEROL (SYMBICORT) 160-4.5 MCG/ACT INHALER budesonide-formoterol (SYMBICORT) 160-4.5 MCG/ACT inhaler      Inhale 2 puffs into the lungs 2 (two) times daily.    Inhale 2 puffs into the lungs 2 (two) times daily.   FLUTICASONE (FLONASE) 50 MCG/ACT NASAL SPRAY fluticasone (FLONASE) 50 MCG/ACT nasal spray      Place 2 sprays into both nostrils daily. Need annual appointment for further refills    Place 2 sprays into both nostrils daily.  Discontinued Medications   FLUTICASONE (FLONASE) 50 MCG/ACT NASAL SPRAY    Place 2 sprays into both nostrils daily.   TRIAMTERENE-HYDROCHLOROTHIAZIDE (MAXZIDE-25) 37.5-25 MG TABLET    Take 1 tablet by mouth daily.

## 2018-02-04 ENCOUNTER — Encounter: Payer: Self-pay | Admitting: Pulmonary Disease

## 2018-02-04 NOTE — Progress Notes (Addendum)
Subjective:     Patient ID: Theresa Terry, female   DOB: 07-Mar-1951, 67 y.o.   MRN: [id number]  HPI 67 y/o BF w/ AB/ RADS- primary triggers are URIs, norm PFTs 10/2014, GERD & LPR...   ~  March 07, 2015:  Initial pulmonary consult w/ SN>  67 y/o BF, referred by DrDaub for a pulmonary evaluation due to recurrent asthmatic bronchitis symptoms>       Theresa Terry is a never smoker w/ hx of recurrent upper resp infections that lead to persistent cough, chest congestion, wheezing, and dyspnea;  She notes that the infections are "hard to shake" and the symptoms "come right back when I finish the meds";  She has had OVs monthly since [id number] betw DrKollar, [facility], & DrHopper- over this time frame she has been treated w/ ZPak, Pred, Flovent then Qvar, Ventolin rescue, Claritin, Flonase, Tessalon;  She had similar problem in 2014 w/ several ER visits & she notes that all the meds seem to help but the symptoms come back when she stops them... The main trigger seems to be upper resp infections, BUT she also c/o cough & wheezing at nights & AM cough as well- she checked this out on-line & wonders if it could relate to her prob...       Theresa Terry was prev employed at [employer] in the 1970s; she had a job in Print[employer] & worked a lot in [employer]- while it was very dusty she was not around the raw cotton/ carding room/ etc; she similarly denies any asbestos exposure or other known toxic exposures, currently employed cleaning houses...  FamHx is neg for lung problems...      EXAM reveals Afeb, VSS, O2sat=98% on RA;  HEENT- neg, mallampati1;  Chest- clear w/o w/r/r, no congestion, cough is dry;  Heart- RR w/o m/r/g;  Abd- soft, nontender, neg;  Ext- neg w/o c/c/e...  CXR 02/24/15 showed mild cardiomegaly, clear lungs, NAD and unchanged from old films...   PFT 11/12/14 showed FVC=2.54 (114), FEV1=1.97 (114), %1sec=77, mid-flows are wnl at 101% predicted; TLC & RV were mildly reduced and DLCO was  wnl...   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 w/ her main triggers usually upper resp infections;  This pattern has been repeated over the yrs starting in ?2014 & she usually responds to antibiotics, Pred, inhaled bronchodil & ICS;  The pattern over the last several months is c/w this diagnosis & she finally improved w/ the Pred;  I believe that she would benefit from an ICS/LABA combination inhaler to use regularly & hopefully prevent the freq recurrences that tended to occur when she runs out of meds;  She stopped prev Flovent & never filled the Qvar due to cost ($200);  We checked in Epic & her insurance should cover SYMBICORT160- 2spBid;  She has VENTOLIN rescue inhaler to use 1-2sp Q6H prn;  Her other issue appears to be GERD/ reflux w/ nocturnal & early AM symptoms- we discussed a vigorous antireflux regimen w/ Protonix40, NPO after dinner, elev HOB 6" (see AVS);  She will call w/ questions or problems and we plan recheck in 6 weeks...  ~  April 18, 2015:  6wk ROV w/ SN>  [name] returns on her Symbicort- 2spBid, Ventolin rescue inhaler (rarely uses), and her vigorous antireflux regimen (Protonix40 before dinner,  NPO after dinner, elev HOB 6")- much improved, resting better, no nighttime cough & improved days; notes min cough (some drainage), no sput, no hemoptysis, denies dyspnea but she is still too sedentary & we discussed the importance of exercise; NOTE> she refuses vaccines...     AR> on Claritin & Flonase doing satis...    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    GERD & prob LPR> improved on Protonix40Qhs, NPO after dinner, elev HOB 6"...    Medical issues>  HBP on Maxzide-25, DJD, Anxiety- followed by DrCrawford...  EXAM reveals Afeb,  VSS, O2sat=98% on RA;  HEENT- neg, mallampati1;  Chest- clear w/o w/r/r, no congestion, cough is dry;  Heart- RR w/o m/r/g;  Abd- soft, nontender, neg;  Ext- neg w/o c/c/e... IMP/PLAN>>  Theresa Terry is improved on her inhalers regularly & by following a vigorous antireflux regimen for her GERD/LPR; continue same rx...  ~  October 17, 2015:  67yo ROV & Theresa Terry reports that she continues to do well, no recurrent AB episodes or refractory attacks; she tells me that she stopped the Symbicort160 on her own & now uses it just prn, and she hasn't needed the rescue inhaler at all; she does continue on the vigorous antireflux regimen (Protonix40 before dinner, NPO after dinner, elev HOB 6") & this seems to have made all the difference for her... we reviewed the following medical problems during today's office visit >>     AR> on Claritin & Flonase doing satis...    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    GERD & prob LPR> improved on Protonix40Qhs, NPO after dinner, elev HOB 6"...    Medical issues>  HBP on Maxzide-25, DJD, Anxiety, she refuses vaccines- followed by DrCrawford... NOTE: prev elev CALCIUM level at 11.1 Apr2016 needs recheck w/ PTH level to r/o primary hyperpara... EXAM reveals Afeb, VSS, O2sat=94% on RA;  HEENT- neg, mallampati1;  Chest- clear w/o w/r/r, no congestion, cough is dry;  Heart- RR w/o m/r/g;  Abd- soft, nontender, neg;  Ext- neg w/o c/c/e IMP/PLAN>>  Theresa Terry continues to do well from the pulm standpoint; she should take her Symbicort more regularly & definitely needs to incr her exercise program; continue antireflux regimen; we discussed the needed adult vaccinations (she continues to refuse vaccines); rec f/u w/ Pulm prn...  ~  August 10, 2016:  10 month ROV & Theresa Terry is a never smoker w/ hx recurrent URIs/ AB/ RADS/  LPR & GERD; she tells me that she bought a nebulizer on her own & wants medication to put in it if needs- we discussed Albuterol 0.83... We reviewed the following medical problems during today's office visit >>     AR> on Claritin & Flonase doing satis...    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    GERD & prob LPR> improved on Nexium20/d, NPO after dinner, elev HOB 6"...    Medical issues>  HBP on Maxzide-25; Hyperparathyroidism w/ elev Ca & PTH, plus  low Phos; DJD; Anxiety; she refuses all vaccines- followed by DrCrawford... EXAM reveals Afeb, VSS, O2sat=100% on RA;  HEENT- neg w/o nodules, mallampati1;  Chest- clear w/o w/r/r, no congestion, cough is dry;  Heart- RR w/o m/r/g;  Abd- soft, nontender, neg;  Ext- neg w/o c/c/e  CXR 08/10/16>  Borderline heart size, clear lungs, NAD...  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  ~  February 08, 2017:  55mo ROV & last visit Sharyn purchased a Quarry manager & we wrote for Prn Albuterol for Neb in addition to her Symbicort160-2spBid;  Today she reports doing well & hasn't needed the NEBs in the interval!  AND she is still NOT using the symbicort regularly- eg just twice last week;  She says breathing well, no cough/ sput/ hemoptysis, chr stable DOE when rushing or taking stairs otherw not exercising etc... We did follow up labs today. We reviewed the following medical problems during today's  office visit >>     AR> on Claritin & Flonase doing satis...    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    GERD & prob LPR> improved on Nexium20/d, NPO after dinner, elev HOB 6"...    Medical issues>  PCP is DrCrawford> HBP on Maxzide-25; Hyperparathyroidism w/ elev Ca & PTH & low Phos; DJD; Anxiety; she refuses all vaccines... EXAM reveals Afeb, VSS, O2sat=96% on RA;  HEENT- neg w/o nodules, mallampati1;  Chest- clear w/o w/r/r, no congestion, cough is dry;  Heart- RR w/o m/r/g;  Abd- soft, nontender, neg;  Ext- neg w/o c/c/e  LABS 02/08/17>  Ca=10.2;  PTH level = 63 (upper lim & not progressive IMP/PLAN>>  Sophiah remains stable despite her poor compliance w/ medical recommendations- not using Symbicort regularly, but neither has she needed the rescue inhaler OR the NEBS;  She still steadfastly refuses all vaccinations;  We plan ROV recheck in 55mo...   ~  August 11, 2017:  55mo ROV & since last visit Julyanna has continued to do well, no change in DOE w/ rushing or on stairs, and she has continued to use just prn "I take herbal tea" she says;  She has not yet followed up w/ DrCrawford, her PCP (last 11/2015);  She has continued to refuse all vaccinations... We did follow up blood work today... We reviewed the following medical problems during today's office visit>      AR> on Claritin & Flonase prn & doing satis...    AB/RADS> on Symbicort160-2spBid (but she decr on her own to just prn), Ventolin rescue inhaler vs Home NEB w/ Albuterol; Hx of refractory AB infections w/ RADS prev requiring mult ER visits, improved w/ this Rx & vigorous antireflux regimen...    GERD & prob LPR> improved on Nexium20/d, NPO after dinner, elev HOB 6"...    Medical issues>  PCP is DrCrawford> HBP on Maxzide-25; overweight;  Hyperparathyroidism w/ elev Ca & PTH & low Phos; DJD; Anxiety; she refuses all vaccines... EXAM reveals Afeb, VSS, O2sat=96% on  RA;  HEENT- neg w/o nodules, mallampati1;  Chest- clear w/o w/r/r, no congestion, cough is dry;  Heart- RR w/o m/r/g;  Abd- soft, nontender, neg;  Ext- neg w/o c/c/e  LABS 08/11/17>  Chems- wnl w/ K=4.1, BS=103, Cr=0.93, Ca=11.2;  CBC- wnl w/ Hg=15.8;  TSH=1.27;  Vit D level = 93;  PTH= 48 IMP/PLAN>>  Zoii remains medically stable, still NOT using her inhalers regularly but also not needing her rescue inhalers at all;  She continues to refuse all immunizations despite my most persuasive arguments in favor of vaccination;  Her serum calcium level is elev but her PTH level is now wnl- reminded to avoid all calcium supplements!   ~  February 03, 2018:  27mo ROV & Dua returns for Pulmonary follow up visit- she is folowed by DrPlotnikov for PCP now- she denies cough, sput production, hemoptysis; denies CP/ palpit/ edema; she notes chr stable DOE w/ rushing or stairs;  She is followed for AR/ AB/ RADS on Symbicort160-2spBid, Claritin10, Flonase Qhs, and NEBS vs AlbutHFA prn...  She was switched to Amlodipine5 for her BP but doesn't like this med & she is asking me to switch her back to Maxzide-25 one daily- OK... We discussed that what she really needs to help her BP & dyspnea is weight reduction- reviewed w/ her DIET/ EXERCISE PROGRAM/ Weight reduction strategies...  We reviewed the following medical problems during today's office visit>      AR> on Claritin & Flonase prn & doing satis...    AB/RADS> on Symbicort160-2spBid (but she decr on her own to just prn), Ventolin rescue inhaler vs Home NEB w/ Albuterol; Hx of refractory AB infections w/ RADS prev requiring mult ER visits, improved w/ this Rx & vigorous antireflux regimen;  CXR 07/2016 was clear...    GERD & prob LPR> improved on Nexium20/d, NPO after dinner, elev HOB 6"...    Medical issues>  PCP is DrPlotnikov> HBP on  Maxzide-25; overweight; Hyperparathyroidism w/ elev Ca & PTH & low Phos; Osteoporosis w/ Tscore -2.5 in spine (10/2017)- pending discussion regarding treatment options; DJD; Anxiety; she refuses all vaccines... EXAM reveals Afeb, VSS, O2sat=97% on RA;  HEENT- neg w/o nodules, mallampati1;  Chest- clear w/o w/r/r, no congestion, cough is dry;  Heart- RR w/o m/r/g;  Abd- soft, nontender, neg;  Ext- neg w/o c/c/e IMP/PLAN>>  Theresa Boss asked that I refill her Maxzide-25 as she prefers this to the [facility]- [place]; she knows about the low sodium diet & the need for wt loss; discussed exercise & wt reduction strategies as well;  We discussed my up-coming retirement & I have rec to her that we turn over her Pulm- AB/RADS follow up to DrPlotnikov for refills etc & if she develops pulm issues in the future he can refer back to [facility] team as needed...     Past Medical History  Diagnosis Date  . Hypertension >> on Maxzide-25   . Asthma >> SEE ABOVE   . Arthritis   . GERD (gastroesophageal reflux disease) > adding Nexium240 & antireflux regimen   . Allergy    Hyperparathyroidisn w/ sl elev Ca, reduced Phos, and elev PTH level - on observation   . Cataract     Past Surgical History:  Procedure Laterality Date  . ABDOMINAL HYSTERECTOMY    . TONSILLECTOMY AND ADENOIDECTOMY  1961    Outpatient Encounter Medications as of 02/03/2018  Medication Sig  . albuterol (PROVENTIL HFA;VENTOLIN HFA) 108 (90 Base) MCG/ACT inhaler Inhale 1-2 puffs into the lungs  every 6 (six) hours as needed for wheezing.  Marland Kitchen albuterol (PROVENTIL) (2.5 MG/3ML) 0.083% nebulizer solution Take 3 mLs (2.5 mg total) by nebulization 3 (three) times daily as needed for wheezing or shortness of breath.  . budesonide-formoterol (SYMBICORT) 160-4.5 MCG/ACT inhaler Inhale 2 puffs into the lungs 2 (two) times daily.  Marland Kitchen esomeprazole (NEXIUM) 20 MG capsule Take 1 capsule (20 mg total) by mouth daily at 12 noon.  . fluticasone (FLONASE) 50 MCG/ACT nasal spray  Place 2 sprays into both nostrils daily. Need annual appointment for further refills  . ibuprofen (ADVIL,MOTRIN) 200 MG tablet Take 200 mg by mouth every 6 (six) hours as needed.  . loratadine (CLARITIN) 10 MG tablet Take 10 mg by mouth daily.  . Multiple Vitamin (MULTIVITAMIN) tablet Take 1 tablet by mouth daily.  . [DISCONTINUED] neomycin-polymyxin-hydrocortisone (CORTISPORIN) 3.5-10000-1 OTIC suspension Place 3 drops into the right ear 3 (three) times daily.  Marland Kitchen triamterene-hydrochlorothiazide (MAXZIDE-25) 37.5-25 MG tablet Take 1 tablet by mouth daily.  . [DISCONTINUED] alendronate (FOSAMAX) 70 MG tablet Take 1 tablet (70 mg total) by mouth every 7 (seven) days. Take with a full glass of water on an empty stomach. (Patient not taking: Reported on 02/03/2018)  . [DISCONTINUED] amLODipine (NORVASC) 5 MG tablet Take 1 tablet (5 mg total) by mouth daily. (Patient not taking: Reported on 02/03/2018)   No facility-administered encounter medications on file as of 02/03/2018.     No Known Allergies    There is no immunization history on file for this patient. >> she has refused vaccinations! She needs to be sure that she gets all her needed vaccines & will check w/ her PCP.   Current Medications, Allergies, Past Medical History, Past Surgical History, Family History, and Social History were reviewed in [employer] record.   Review of Systems            All symptoms NEG except where BOLDED >>  Constitutional:  F/C/S, fatigue, anorexia, unexpected weight change. HEENT:  HA, visual changes, hearing loss, earache, nasal symptoms, sore throat, mouth sores, hoarseness. Resp:  cough, sputum, hemoptysis; SOB, tightness, wheezing. Cardio:  CP, palpit, DOE, orthopnea, edema. GI:  N/V/D/C, blood in stool; reflux, abd pain, distention, gas. GU:  dysuria, freq, urgency, hematuria, flank pain, voiding difficulty. MS:  joint pain, swelling, tenderness, decr ROM; neck pain, back pain,  etc. Neuro:  HA, tremors, seizures, dizziness, syncope, weakness, numbness, gait abn. Skin:  suspicious lesions or skin rash. Heme:  adenopathy, bruising, bleeding. Psyche:  confusion, agitation, sleep disturbance, hallucinations, anxiety, depression suicidal.   Objective:   Physical Exam      Vital Signs:  Reviewed...   General:  WD, WN, 67 y/o BF in NAD; alert & oriented; pleasant & cooperative... HEENT:  [place]/AT; Conjunctiva- pink, Sclera- nonicteric, EOM-wnl, PERRLA, EACs-clear, TMs-wnl; NOSE-clear; THROAT-clear & wnl.  Neck:  Supple w/ fairROM; no JVD; normal carotid impulses w/o bruits; no thyromegaly or nodules palpated; no lymphadenopathy.  Chest:  Clear to P & A; without wheezes, rales, or rhonchi heard. Heart:  Regular Rhythm; norm S1 & S2 without murmurs, rubs, or gallops detected. Abdomen:  Soft & nontender- no guarding or rebound; normal bowel sounds; no organomegaly or masses palpated. Ext:  DecrROM; without deformities +arthritic changes; no varicose veins, venous insuffic, or edema;  Pulses intact w/o bruits. Neuro:  No focal neuro deficits; sensory testing normal; gait normal & balance OK. Derm:  No lesions noted; no rash etc. Lymph:  No cervical, supraclavicular, axillary, or inguinal  adenopathy palpated.   Assessment:      IMP >>     AR> on Claritin & Flonase doing satis...    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    GERD & prob LPR> improved on Protonix40Qhs, NPO after dinner, elev HOB 6"...    Medical issues>  HBP on Maxzide-25, Hyperparathyroidism , DJD, Anxiety, she refuses vaccines- followed by DrCrawford...  PLAN >>  03/07/15>   Alixandria appears to have AB/ RADS w/ her main triggers usually upper resp infections;  This pattern has been repeated over the yrs starting in ?2014 & she  usually responds to antibiotics, Pred, inhaled bronchodil & ICS;  The pattern over the last several months is c/w this diagnosis & she finally improved w/ the Pred;  I believe that she would benefit from an ICS/LABA combination inhaler to use regularly & hopefully prevent the freq recurrences that tended to occur when she runs out of meds;  She stopped prev Flovent & never filled the Qvar due to cost ($200);  We checked in Epic & her insurance should cover SYMBICORT160- 2spBid;  She has VENTOLIN rescue inhaler to use 1-2sp Q6H prn;  Her other issue appears to be GERD/ reflux w/ nocturnal & early AM symptoms- we discussed a vigorous antireflux regimen w/ Protonix40, NPO after dinner, elev HOB 6" (see AVS);  She will call w/ questions or problems and we plan recheck in 6 weeks. 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 02/08/17>   Solveig remains stable despite her poor compliance w/ medical recommendations- not using Symbicort regularly, but neither has she needed the rescue inhaler OR the NEBS;  She still steadfastly refuses all vaccinations;  We plan ROV recheck in 6mo 08/11/17>   Florabelle remains medically stable, still NOT using her inhalers regularly but also not needing her rescue inhalers at all;  She continues to refuse all immunizations despite my most persuasive arguments in favor of  vaccination;  Her serum calcium level is elev but her PTH level is now wnl- reminded to avoid all calcium supplements!   Plan:     Patient's Medications  New Prescriptions   TRIAMTERENE-HYDROCHLOROTHIAZIDE (MAXZIDE-25) 37.5-25 MG TABLET    Take 1 tablet by mouth daily.  Previous Medications   ALBUTEROL (PROVENTIL HFA;VENTOLIN HFA) 108 (90 BASE) MCG/ACT INHALER    Inhale 1-2 puffs into the lungs every 6 (six) hours as needed for wheezing.   ALBUTEROL (PROVENTIL) (2.5 MG/3ML) 0.083% NEBULIZER SOLUTION    Take 3 mLs (2.5 mg total) by nebulization 3 (three) times daily as needed for wheezing or shortness of breath.   BUDESONIDE-FORMOTEROL (SYMBICORT) 160-4.5 MCG/ACT INHALER    Inhale 2 puffs into the lungs 2 (two) times daily.   ESOMEPRAZOLE (  NEXIUM) 20 MG CAPSULE    Take 1 capsule (20 mg total) by mouth daily at 12 noon.   FLUTICASONE (FLONASE) 50 MCG/ACT NASAL SPRAY    Place 2 sprays into both nostrils daily. Need annual appointment for further refills   IBUPROFEN (ADVIL,MOTRIN) 200 MG TABLET    Take 200 mg by mouth every 6 (six) hours as needed.   LORATADINE (CLARITIN) 10 MG TABLET    Take 10 mg by mouth daily.   MULTIPLE VITAMIN (MULTIVITAMIN) TABLET    Take 1 tablet by mouth daily.  Modified Medications   No medications on file  Discontinued Medications   ALENDRONATE (FOSAMAX) 70 MG TABLET    Take 1 tablet (70 mg total) by mouth every 7 (seven) days. Take with a full glass of water on an empty stomach.   AMLODIPINE (NORVASC) 5 MG TABLET    Take 1 tablet (5 mg total) by mouth daily.   NEOMYCIN-POLYMYXIN-HYDROCORTISONE (CORTISPORIN) 3.5-10000-1 OTIC SUSPENSION    Place 3 drops into the right ear 3 (three) times daily.

## 2018-02-14 ENCOUNTER — Encounter: Payer: Medicare HMO | Admitting: Internal Medicine

## 2018-03-11 ENCOUNTER — Ambulatory Visit (INDEPENDENT_AMBULATORY_CARE_PROVIDER_SITE_OTHER): Payer: Medicare HMO | Admitting: Internal Medicine

## 2018-03-11 ENCOUNTER — Encounter: Payer: Self-pay | Admitting: Internal Medicine

## 2018-03-11 VITALS — BP 132/84 | HR 77 | Temp 97.8°F | Ht 62.0 in | Wt 173.0 lb

## 2018-03-11 DIAGNOSIS — M810 Age-related osteoporosis without current pathological fracture: Secondary | ICD-10-CM | POA: Diagnosis not present

## 2018-03-11 DIAGNOSIS — Z23 Encounter for immunization: Secondary | ICD-10-CM

## 2018-03-11 DIAGNOSIS — J683 Other acute and subacute respiratory conditions due to chemicals, gases, fumes and vapors: Secondary | ICD-10-CM | POA: Diagnosis not present

## 2018-03-11 DIAGNOSIS — B001 Herpesviral vesicular dermatitis: Secondary | ICD-10-CM | POA: Diagnosis not present

## 2018-03-11 DIAGNOSIS — E213 Hyperparathyroidism, unspecified: Secondary | ICD-10-CM

## 2018-03-11 MED ORDER — VITAMIN D3 50 MCG (2000 UT) PO CAPS: 2000.0000 [IU] | ORAL_CAPSULE | Freq: Every day | ORAL | 3 refills | Status: AC

## 2018-03-11 MED ORDER — VALACYCLOVIR HCL 500 MG PO TABS: 500.0000 mg | ORAL_TABLET | Freq: Two times a day (BID) | ORAL | 1 refills | Status: DC

## 2018-03-11 NOTE — Patient Instructions (Addendum)
Stop Vit D x 1 month, then re-start at 1000-2000 iu a day Labs in 3 months     Cold Sore A cold sore, also called a fever blister, is a skin infection that is caused by a virus. This infection causes small, fluid-filled sores to form inside of the mouth or on the lips, gums, nose, chin, or cheeks. Cold sores can spread to other parts of the body, such as the eyes or fingers. Cold sores can be spread or passed from person to person (contagious) until the sores crust over completely. Cold sores can be spread through close contact, such as kissing or sharing a drinking glass. Follow these instructions at home: Medicines  Take or apply over-the-counter and prescription medicines only as told by your doctor.  Use a cotton-tip swab to apply creams or gels to your sores. Sore Care  Do not touch the sores or pick the scabs.  Wash your hands often. Do not touch your eyes without washing your hands first.  Keep the sores clean and dry.  If directed, apply ice to the sores:  Put ice in a plastic bag.  Place a towel between your skin and the bag.  Leave the ice on for 20 minutes, 2-3 times per day. Lifestyle  Do not kiss, have oral sex, or share personal items until your sores heal.  Eat a soft, bland diet. Avoid eating hot, cold, or salty foods. These can hurt your mouth.  Use a straw if it hurts to drink out of a glass.  Avoid the sun and limit your stress if these things trigger outbreaks. If sun causes cold sores, apply sunscreen on your lips before being out in the sun. Contact a doctor if:  You have symptoms for more than two weeks.  You have pus coming from the sores.  You have redness that is spreading.  You have pain or irritation in your eye.  You get sores on your genitals.  Your sores do not heal within two weeks.  You get cold sores often. Get help right away if:  You have a fever and your symptoms suddenly get worse.  You have a headache and  confusion. This information is not intended to replace advice given to you by your health care provider. Make sure you discuss any questions you have with your health care provider. Document Released: 12/15/2011 Document Revised: 11/21/2015 Document Reviewed: 04/05/2015 Elsevier Interactive Patient Education  [employer]2018 [employer]

## 2018-03-11 NOTE — Assessment & Plan Note (Signed)
Repeat labs:

## 2018-03-11 NOTE — Progress Notes (Signed)
**Note Theresa-Identified via Obfuscation** Subjective:  Patient ID: Theresa Terry, female    DOB: 07-Sep-1950  Age: 67 y.o. MRN: [id number]  CC: No chief complaint on file.   HPI Theresa Terry presents for reactive airway disease, HTN, GERD f/u  Outpatient Medications Prior to Visit  Medication Sig Dispense Refill  . albuterol (PROVENTIL HFA;VENTOLIN HFA) 108 (90 Base) MCG/ACT inhaler Inhale 1-2 puffs into the lungs every 6 (six) hours as needed for wheezing. 1 Inhaler 11  . albuterol (PROVENTIL) (2.5 MG/3ML) 0.083% nebulizer solution Take 3 mLs (2.5 mg total) by nebulization 3 (three) times daily as needed for wheezing or shortness of breath. 50 vial 6  . budesonide-formoterol (SYMBICORT) 160-4.5 MCG/ACT inhaler Inhale 2 puffs into the lungs 2 (two) times daily. 1 Inhaler 12  . esomeprazole (NEXIUM) 20 MG capsule Take 1 capsule (20 mg total) by mouth daily at 12 noon. 90 capsule 3  . fluticasone (FLONASE) 50 MCG/ACT nasal spray Place 2 sprays into both nostrils daily. Need annual appointment for further refills 16 g 0  . ibuprofen (ADVIL,MOTRIN) 200 MG tablet Take 200 mg by mouth every 6 (six) hours as needed.    . loratadine (CLARITIN) 10 MG tablet Take 10 mg by mouth daily.    . Multiple Vitamin (MULTIVITAMIN) tablet Take 1 tablet by mouth daily.    Marland Kitchen. triamterene-hydrochlorothiazide (MAXZIDE-25) 37.5-25 MG tablet Take 1 tablet by mouth daily. 90 tablet 3   No facility-administered medications prior to visit.     ROS: Review of Systems  Constitutional: Negative for activity change, appetite change, chills, fatigue and unexpected weight change.  HENT: Negative for congestion, mouth sores and sinus pressure.   Eyes: Negative for visual disturbance.  Respiratory: Negative for cough and chest tightness.   Gastrointestinal: Negative for abdominal pain and nausea.  Genitourinary: Negative for difficulty urinating, frequency and vaginal pain.  Musculoskeletal: Positive for arthralgias. Negative for back pain and gait problem.    Skin: Negative for pallor and rash.  Neurological: Negative for dizziness, tremors, weakness, numbness and headaches.  Psychiatric/Behavioral: Negative for confusion, sleep disturbance and suicidal ideas.    Objective:  BP 132/84 (BP Location: Left Arm, Patient Position: Sitting, Cuff Size: Normal)   Pulse 77   Temp 97.8 F (36.6 C) (Oral)   Ht 5\' 2"  (1.575 m)   Wt 173 lb (78.5 kg)   SpO2 97%   BMI 31.64 kg/m   BP Readings from Last 3 Encounters:  03/11/18 132/84  02/03/18 126/70  01/12/18 (!) 152/90    Wt Readings from Last 3 Encounters:  03/11/18 173 lb (78.5 kg)  02/03/18 177 lb 9.6 oz (80.6 kg)  01/12/18 180 lb (81.6 kg)    Physical Exam  Constitutional: She appears well-developed. No distress.  HENT:  Head: Normocephalic.  Right Ear: External ear normal.  Left Ear: External ear normal.  Nose: Nose normal.  Mouth/Throat: Oropharynx is clear and moist.  Eyes: Pupils are equal, round, and reactive to light. Conjunctivae are normal. Right eye exhibits no discharge. Left eye exhibits no discharge.  Neck: Normal range of motion. Neck supple. No JVD present. No tracheal deviation present. No thyromegaly present.  Cardiovascular: Normal rate, regular rhythm and normal heart sounds.  Pulmonary/Chest: No stridor. No respiratory distress. She has no wheezes.  Abdominal: Soft. Bowel sounds are normal. She exhibits no distension and no mass. There is no tenderness. There is no rebound and no guarding.  Musculoskeletal: She exhibits no edema or tenderness.  Lymphadenopathy:    She has no cervical adenopathy.  Neurological: She displays normal reflexes. No cranial nerve deficit. She exhibits normal muscle tone. Coordination normal.  Skin: No rash noted. No erythema.  Psychiatric: She has a normal mood and affect. Her behavior is normal. Judgment and thought content normal.  L lower lip cold sore Obese  Lab Results  Component Value Date   WBC 8.5 08/11/2017   HGB 15.8 (H)  08/11/2017   HCT 47.2 (H) 08/11/2017   PLT 311.0 08/11/2017   GLUCOSE 103 (H) 08/11/2017   CHOL 211 (H) 12/02/2015   TRIG 154.0 (H) 12/02/2015   HDL 73.70 12/02/2015   LDLCALC 106 (H) 12/02/2015   ALT 18 08/11/2017   AST 17 08/11/2017   NA 139 08/11/2017   K 4.1 08/11/2017   CL 102 08/11/2017   CREATININE 0.93 08/11/2017   BUN 19 08/11/2017   CO2 29 08/11/2017   TSH 1.27 08/11/2017   HGBA1C 5.8 12/02/2015    Dg Bone Density (dxa)  Result Date: 11/12/2017 EXAM: DUAL X-RAY ABSORPTIOMETRY (DXA) FOR BONE MINERAL DENSITY IMPRESSION: Referring Physician:  Lanora Manis A CRAWFORD PATIENT: Name: Theresa, Terry Patient ID: [id number] Birth Date: [birth year] Height: 60.0 in. Sex: Female Measured: 11/12/2017 Weight: 176.1 lbs. Indications: Albuterol, Bilateral Ovariectomy (65.51), Estrogen Deficient, Postmenopausal Fractures: Foot Treatments: Multivitamin ASSESSMENT: The BMD measured at AP Spine L2-L4 is 0.899 g/cm2 with a T-score of -2.5. This patient is considered osteoporotic according to World Health Organization [facility]) criteria. This scan is considered good quality. L-1 was excluded due to degenerative changes. Site Region Measured Date Measured Age YA BMD Significant CHANGE T-score AP Spine  L2-L4     11[age]12/2017    68.9         -2.5    0.899 g/cm2 DualFemur Neck Left 11/12/2017    68.9         -2.1    0.750 g/cm2 World Health Organization [facility]) criteria for post-menopausal, Caucasian Women: Normal       T-score at or above -1 SD Osteopenia   T-score between -1 and -2.5 SD Osteoporosis T-score at or below -2.5 SD RECOMMENDATION: 1. All patients should optimize calcium and vitamin D intake. 2. Consider FDA approved medical therapies in postmenopausal women and men aged 5 years and older, based on the following: a. A hip or vertebral (clinical or morphometric) fracture b. T- score < or = -2.5 at the femoral neck or spine after appropriate evaluation to exclude secondary causes c. Low bone mass (T-score  between -1.0 and -2.5 at the femoral neck or spine) and a 10 year probability of a hip fracture > or = 3% or a 10 year probability of a major osteoporosis-related fracture > or = 20% based on the [place]-adapted WHO algorithm d. Clinician judgment and/or patient preferences may indicate treatment for people with 10-year fracture probabilities above or below these levels FOLLOW-UP: People with diagnosed cases of osteoporosis or at high risk for fracture should have regular bone mineral density tests. For patients eligible for Medicare, routine testing is allowed once every 2 years. The testing frequency can be increased to one year for patients who have rapidly progressing disease, those who are receiving or discontinuing medical therapy to restore bone mass, or have additional risk factors. I have reviewed this report and agree with the above findings. [facility] Electronically Signed   By: Amie Portland M.D.   On: 11/12/2017 11:05   Mm 3d Screen Breast Bilateral  Result Date: 11/12/2017 CLINICAL DATA:  Screening. EXAM: DIGITAL SCREENING BILATERAL MAMMOGRAM WITH TOMO AND  CAD COMPARISON:  Previous exam(s). ACR Breast Density Category a: The breast tissue is almost entirely fatty. FINDINGS: There are no findings suspicious for malignancy. Images were processed with CAD. IMPRESSION: No mammographic evidence of malignancy. A result letter of this screening mammogram will be mailed directly to the patient. RECOMMENDATION: Screening mammogram in one year. (Code:SM-B-01Y) BI-RADS CATEGORY  1: Negative. Electronically Signed   By: Britta Mccreedy M.D.   On: 11/12/2017 16:40    Assessment & Plan:   There are no diagnoses linked to this encounter.   No orders of the defined types were placed in this encounter.    Follow-up: No follow-ups on file.  Sonda Primes, MD

## 2018-03-11 NOTE — Assessment & Plan Note (Signed)
Pt declined po meds Vit D

## 2018-03-11 NOTE — Assessment & Plan Note (Signed)
Valtrex prn  

## 2018-03-11 NOTE — Assessment & Plan Note (Signed)
Loratidine Symbicort - using prn

## 2018-04-28 DIAGNOSIS — H40013 Open angle with borderline findings, low risk, bilateral: Secondary | ICD-10-CM | POA: Diagnosis not present

## 2018-04-28 DIAGNOSIS — Z961 Presence of intraocular lens: Secondary | ICD-10-CM | POA: Diagnosis not present

## 2018-06-23 ENCOUNTER — Other Ambulatory Visit (INDEPENDENT_AMBULATORY_CARE_PROVIDER_SITE_OTHER): Payer: Medicare HMO

## 2018-06-23 ENCOUNTER — Encounter: Payer: Self-pay | Admitting: Internal Medicine

## 2018-06-23 ENCOUNTER — Ambulatory Visit (INDEPENDENT_AMBULATORY_CARE_PROVIDER_SITE_OTHER): Payer: Medicare HMO | Admitting: Internal Medicine

## 2018-06-23 DIAGNOSIS — E213 Hyperparathyroidism, unspecified: Secondary | ICD-10-CM | POA: Diagnosis not present

## 2018-06-23 DIAGNOSIS — I10 Essential (primary) hypertension: Secondary | ICD-10-CM | POA: Diagnosis not present

## 2018-06-23 DIAGNOSIS — J0100 Acute maxillary sinusitis, unspecified: Secondary | ICD-10-CM | POA: Diagnosis not present

## 2018-06-23 DIAGNOSIS — M810 Age-related osteoporosis without current pathological fracture: Secondary | ICD-10-CM | POA: Diagnosis not present

## 2018-06-23 DIAGNOSIS — R05 Cough: Secondary | ICD-10-CM

## 2018-06-23 DIAGNOSIS — R058 Other specified cough: Secondary | ICD-10-CM

## 2018-06-23 LAB — BASIC METABOLIC PANEL
BUN: 11 mg/dL (ref 6–23)
CALCIUM: 10 mg/dL (ref 8.4–10.5)
CO2: 27 meq/L (ref 19–32)
Chloride: 108 mEq/L (ref 96–112)
Creatinine, Ser: 0.74 mg/dL (ref 0.40–1.20)
GFR: 99.92 mL/min (ref >60.00)
Glucose, Bld: 103 mg/dL — ABNORMAL HIGH (ref 70–99)
Potassium: 3.8 mEq/L (ref 3.5–5.1)
SODIUM: 142 meq/L (ref 135–145)

## 2018-06-23 LAB — VITAMIN D 25 HYDROXY (VIT D DEFICIENCY, FRACTURES): VITD: 60.8 ng/mL (ref 30.00–100.00)

## 2018-06-23 LAB — TSH: TSH: 0.82 u[IU]/mL (ref 0.35–4.50)

## 2018-06-23 MED ORDER — CEFDINIR 300 MG PO CAPS: 300.0000 mg | ORAL_CAPSULE | Freq: Two times a day (BID) | ORAL | 0 refills | Status: DC

## 2018-06-23 MED ORDER — PROMETHAZINE-CODEINE 6.25-10 MG/5ML PO SYRP: 5.0000 mL | ORAL_SOLUTION | ORAL | 0 refills | Status: DC | PRN

## 2018-06-23 NOTE — Patient Instructions (Addendum)
You can use over-the-counter  "cold" medicines  such as  "Afrin" nasal spray for nasal congestion as directed. Use " Delsym" or" Robitussin" cough syrup varietis for cough.  You can use plain "Tylenol" or "Advil" for fever, chills and achyness. Use Halls or Ricola cough drops.     Please, make an appointment if you are not better or if you're worse.  

## 2018-06-23 NOTE — Progress Notes (Signed)
Subjective:  Patient ID: Theresa Terry, female    DOB: 01-Nov-1950  Age: 67 y.o. MRN: [id number]  CC: No chief complaint on file.   HPI Joellen Tullos presents for a URI x 1 week - green sinus d/c and sinus pain. Lost voice. C/o cough, CP F/u elev Ca and vit D  Outpatient Medications Prior to Visit  Medication Sig Dispense Refill  . albuterol (PROVENTIL HFA;VENTOLIN HFA) 108 (90 Base) MCG/ACT inhaler Inhale 1-2 puffs into the lungs every 6 (six) hours as needed for wheezing. 1 Inhaler 11  . albuterol (PROVENTIL) (2.5 MG/3ML) 0.083% nebulizer solution Take 3 mLs (2.5 mg total) by nebulization 3 (three) times daily as needed for wheezing or shortness of breath. 50 vial 6  . budesonide-formoterol (SYMBICORT) 160-4.5 MCG/ACT inhaler Inhale 2 puffs into the lungs 2 (two) times daily. 1 Inhaler 12  . Cholecalciferol (VITAMIN D3) 2000 units capsule Take 1 capsule (2,000 Units total) by mouth daily. 100 capsule 3  . esomeprazole (NEXIUM) 20 MG capsule Take 1 capsule (20 mg total) by mouth daily at 12 noon. 90 capsule 3  . fluticasone (FLONASE) 50 MCG/ACT nasal spray Place 2 sprays into both nostrils daily. Need annual appointment for further refills 16 g 0  . ibuprofen (ADVIL,MOTRIN) 200 MG tablet Take 200 mg by mouth every 6 (six) hours as needed.    . loratadine (CLARITIN) 10 MG tablet Take 10 mg by mouth daily.    . Multiple Vitamin (MULTIVITAMIN) tablet Take 1 tablet by mouth daily.    Marland Kitchen triamterene-hydrochlorothiazide (MAXZIDE-25) 37.5-25 MG tablet Take 1 tablet by mouth daily. 90 tablet 3  . valACYclovir (VALTREX) 500 MG tablet Take 1 tablet (500 mg total) by mouth 2 (two) times daily. 14 tablet 1   No facility-administered medications prior to visit.     ROS: Review of Systems  Constitutional: Positive for fatigue. Negative for activity change, appetite change, chills, diaphoresis, fever and unexpected weight change.  HENT: Negative for congestion, ear pain, facial swelling, hearing  loss, mouth sores, nosebleeds, postnasal drip, rhinorrhea, sinus pressure, sneezing, sore throat, tinnitus and trouble swallowing.   Eyes: Negative for pain, discharge, redness, itching and visual disturbance.  Respiratory: Positive for cough and wheezing. Negative for chest tightness, shortness of breath and stridor.   Cardiovascular: Positive for chest pain. Negative for palpitations and leg swelling.  Gastrointestinal: Negative for abdominal distention, anal bleeding, blood in stool, constipation, diarrhea, nausea and rectal pain.  Genitourinary: Negative for difficulty urinating, dysuria, flank pain, frequency, genital sores, hematuria, pelvic pain, urgency, vaginal bleeding and vaginal discharge.  Musculoskeletal: Negative for arthralgias, back pain, gait problem, joint swelling, neck pain and neck stiffness.  Skin: Negative.  Negative for rash.  Neurological: Negative for dizziness, tremors, seizures, syncope, speech difficulty, weakness, numbness and headaches.  Hematological: Negative for adenopathy. Does not bruise/bleed easily.  Psychiatric/Behavioral: Negative for behavioral problems, decreased concentration, dysphoric mood and sleep disturbance. The patient is not nervous/anxious.     Objective:  BP (!) 142/96 (BP Location: Left Arm, Patient Position: Sitting, Cuff Size: Normal)   Pulse 83   Temp 98.3 F (36.8 C) (Oral)   Ht 5\' 2"  (1.575 m)   Wt 173 lb (78.5 kg)   SpO2 96%   BMI 31.64 kg/m   BP Readings from Last 3 Encounters:  06/23/18 (!) 142/96  03/11/18 132/84  02/03/18 126/70    Wt Readings from Last 3 Encounters:  06/23/18 173 lb (78.5 kg)  03/11/18 173 lb (78.5 kg)  02/03/18 177  lb 9.6 oz (80.6 kg)    Physical Exam Constitutional:      General: She is not in acute distress.    Appearance: She is well-developed.  HENT:     Head: Normocephalic.     Right Ear: External ear normal.     Left Ear: External ear normal.     Nose: Nose normal.  Eyes:      General:        Right eye: No discharge.        Left eye: No discharge (cefd).     Conjunctiva/sclera: Conjunctivae normal.     Pupils: Pupils are equal, round, and reactive to light.  Neck:     Musculoskeletal: Normal range of motion and neck supple.     Thyroid: No thyromegaly.     Vascular: No JVD.     Trachea: No tracheal deviation.  Cardiovascular:     Rate and Rhythm: Normal rate and regular rhythm.     Heart sounds: Normal heart sounds.  Pulmonary:     Effort: No respiratory distress.     Breath sounds: No stridor. No wheezing.  Abdominal:     General: Bowel sounds are normal. There is no distension.     Palpations: Abdomen is soft. There is no mass.     Tenderness: There is no abdominal tenderness. There is no guarding or rebound.  Musculoskeletal:        General: No tenderness.  Lymphadenopathy:     Cervical: No cervical adenopathy.  Skin:    Findings: No erythema or rash.  Neurological:     Cranial Nerves: No cranial nerve deficit.     Motor: No abnormal muscle tone.     Coordination: Coordination normal.     Deep Tendon Reflexes: Reflexes normal.  Psychiatric:        Behavior: Behavior normal.        Thought Content: Thought content normal.        Judgment: Judgment normal.   hoarse eryth throat  Lab Results  Component Value Date   WBC 8.5 08/11/2017   HGB 15.8 (H) 08/11/2017   HCT 47.2 (H) 08/11/2017   PLT 311.0 08/11/2017   GLUCOSE 103 (H) 08/11/2017   CHOL 211 (H) 12/02/2015   TRIG 154.0 (H) 12/02/2015   HDL 73.70 12/02/2015   LDLCALC 106 (H) 12/02/2015   ALT 18 08/11/2017   AST 17 08/11/2017   NA 139 08/11/2017   K 4.1 08/11/2017   CL 102 08/11/2017   CREATININE 0.93 08/11/2017   BUN 19 08/11/2017   CO2 29 08/11/2017   TSH 1.27 08/11/2017   HGBA1C 5.8 12/02/2015    Dg Bone Density (dxa)  Result Date: 11/12/2017 EXAM: DUAL X-RAY ABSORPTIOMETRY (DXA) FOR BONE MINERAL DENSITY IMPRESSION: Referring Physician:  Lanora ManisELIZABETH A CRAWFORD PATIENT:  Name: De BurrsBracken, Davielle Patient ID: [id number] Birth Date: 1951-01-12 Height: 60.0 in. Sex: Female Measured: 11/12/2017 Weight: 176.1 lbs. Indications: Albuterol, Bilateral Ovariectomy (65.51), Estrogen Deficient, Postmenopausal Fractures: Foot Treatments: Multivitamin ASSESSMENT: The BMD measured at AP Spine L2-L4 is 0.899 g/cm2 with a T-score of -2.5. This patient is considered osteoporotic according to World Health Organization [facility](WHO) criteria. This scan is considered good quality. L-1 was excluded due to degenerative changes. Site Region Measured Date Measured Age YA BMD Significant CHANGE T-score AP Spine  L2-L4     11[age]12/2017    68.9         -2.5    0.899 g/cm2 DualFemur Neck Left 11/12/2017    68.9         -  2.1    0.750 g/cm2 World Health Organization [facility](WHO) criteria for post-menopausal, Caucasian Women: Normal       T-score at or above -1 SD Osteopenia   T-score between -1 and -2.5 SD Osteoporosis T-score at or below -2.5 SD RECOMMENDATION: 1. All patients should optimize calcium and vitamin D intake. 2. Consider FDA approved medical therapies in postmenopausal women and men aged 69 years and older, based on the following: a. A hip or vertebral (clinical or morphometric) fracture b. T- score < or = -2.5 at the femoral neck or spine after appropriate evaluation to exclude secondary causes c. Low bone mass (T-score between -1.0 and -2.5 at the femoral neck or spine) and a 10 year probability of a hip fracture > or = 3% or a 10 year probability of a major osteoporosis-related fracture > or = 20% based on the [place]-adapted WHO algorithm d. Clinician judgment and/or patient preferences may indicate treatment for people with 10-year fracture probabilities above or below these levels FOLLOW-UP: People with diagnosed cases of osteoporosis or at high risk for fracture should have regular bone mineral density tests. For patients eligible for Medicare, routine testing is allowed once every 2 years. The testing frequency can be  increased to one year for patients who have rapidly progressing disease, those who are receiving or discontinuing medical therapy to restore bone mass, or have additional risk factors. I have reviewed this report and agree with the above findings. [facility] Electronically Signed   By: Amie Portlandavid  Ormond M.D.   On: 11/12/2017 11:05   Mm 3d Screen Breast Bilateral  Result Date: 11/12/2017 CLINICAL DATA:  Screening. EXAM: DIGITAL SCREENING BILATERAL MAMMOGRAM WITH TOMO AND CAD COMPARISON:  Previous exam(s). ACR Breast Density Category a: The breast tissue is almost entirely fatty. FINDINGS: There are no findings suspicious for malignancy. Images were processed with CAD. IMPRESSION: No mammographic evidence of malignancy. A result letter of this screening mammogram will be mailed directly to the patient. RECOMMENDATION: Screening mammogram in one year. (Code:SM-B-01Y) BI-RADS CATEGORY  1: Negative. Electronically Signed   By: Britta MccreedySusan  Turner M.D.   On: 11/12/2017 16:40    Assessment & Plan:   There are no diagnoses linked to this encounter.   No orders of the defined types were placed in this encounter.    Follow-up: No follow-ups on file.  Sonda PrimesAlex Lillianna Sabel, MD

## 2018-06-23 NOTE — Assessment & Plan Note (Signed)
Off Vit D now Re-check labs

## 2018-06-24 ENCOUNTER — Other Ambulatory Visit: Payer: Self-pay

## 2018-06-24 DIAGNOSIS — J019 Acute sinusitis, unspecified: Secondary | ICD-10-CM | POA: Insufficient documentation

## 2018-06-24 LAB — PTH, INTACT AND CALCIUM
Calcium: 9.9 mg/dL (ref 8.6–10.4)
PTH: 27 pg/mL (ref 14–64)

## 2018-06-24 NOTE — Telephone Encounter (Signed)
**Note Theresa-Identified via Obfuscation**     2  Theresa Terry Female, 67 y.o., [birth year] MRN:  [id number] Phone:  [phone] Judie Petit(M) PCP:  Tresa GarterPlotnikov, Aleksei V, MD Primary CvgFrancine Graven:  HU[employer] MEDICARE/HU[employer]NA MEDICARE HMO Next Appt With Internal Medicine 09/22/2018 at 8:30 AM Message from Gerrianne ScaleAngela L Payne sent at 06/24/2018 7:23 AM EST   Pt calling stating that the promethazine-codeine (PHENERGAN WITH CODEINE) 6.25-10 MG/5ML syrup cost $93 [facility] her insurance or want pay for it she would like another type of cough syrup or she want to know which pharmacy will take her insurance she is on medicare and medicade  Call History    Type Contact Phone User  06/24/2018 07:20 AM Phone (Incoming) Theresa Terry, Theresa (Self) [phone] Judie Petit(M) Gerrianne ScalePayne, Angela L

## 2018-06-24 NOTE — Assessment & Plan Note (Signed)
Cefdinir

## 2018-06-24 NOTE — Assessment & Plan Note (Signed)
Prom-cod syr 

## 2018-06-24 NOTE — Assessment & Plan Note (Signed)
Off Vit D labs

## 2018-06-24 NOTE — Telephone Encounter (Signed)
Pt would like medication to [facility],  RX loaded

## 2018-06-24 NOTE — Assessment & Plan Note (Signed)
BP Readings from Last 3 Encounters:  06/23/18 (!) 142/96  03/11/18 132/84  02/03/18 126/70

## 2018-06-27 ENCOUNTER — Telehealth: Payer: Self-pay | Admitting: Internal Medicine

## 2018-06-27 MED ORDER — PROMETHAZINE-CODEINE 6.25-10 MG/5ML PO SYRP: 5.0000 mL | ORAL_SOLUTION | ORAL | 0 refills | Status: DC | PRN

## 2018-06-27 NOTE — Addendum Note (Signed)
Addended by: Tresa GarterPLOTNIKOV, Lennie Vasco V on: 06/27/2018 03:49 PM   Modules accepted: Orders

## 2018-06-27 NOTE — Telephone Encounter (Signed)
Routing to dr plotnikov, please advise, thanks 

## 2018-06-27 NOTE — Telephone Encounter (Signed)
Ok Done Thx 

## 2018-06-27 NOTE — Telephone Encounter (Signed)
Patient called back to say that medication(promethazine-codeine (PHENERGAN WITH CODEINE) 6.25-10 MG/5ML syrup)   [facility] [address] and she would like Rx sent to that pharmacy please.

## 2018-06-27 NOTE — Telephone Encounter (Signed)
Copied from CRM [phone]#[id number]. Topic: Quick Communication - Rx Refill/Question >> Jun 27, 2018  7:29 AM Fanny BienIlderton, Jessica L wrote: Medication: promethazine-codeine (PHENERGAN WITH CODEINE) 6.25-10 MG/5ML syrup [[id number]][[id number]] [facility] not get [facility] at [facility] due to the price   Has the patient contacted their pharmacy? no Preferred Pharmacy (with phone number or street name):[facility] [id number] - [place], [place] - [address] [phone] (Phone) [phone] (Fax)    Agent: Please be advised that RX refills may take up to 3 business days. We ask that you follow-up with your pharmacy.

## 2018-06-27 NOTE — Telephone Encounter (Signed)
Will route to office for final disposition. 

## 2018-06-28 ENCOUNTER — Telehealth: Payer: Self-pay | Admitting: Internal Medicine

## 2018-06-28 MED ORDER — HYDROCODONE-HOMATROPINE 5-1.5 MG/5ML PO SYRP: 5.0000 mL | ORAL_SOLUTION | Freq: Four times a day (QID) | ORAL | 0 refills | Status: DC | PRN

## 2018-06-28 NOTE — Telephone Encounter (Signed)
Prom cod syr is n/a Rx Tour managerhycodan emailed

## 2018-07-11 ENCOUNTER — Ambulatory Visit: Payer: Medicare HMO | Admitting: Internal Medicine

## 2018-09-21 ENCOUNTER — Telehealth: Payer: Self-pay | Admitting: *Deleted

## 2018-09-21 DIAGNOSIS — R062 Wheezing: Secondary | ICD-10-CM

## 2018-09-21 MED ORDER — ALBUTEROL SULFATE HFA 108 (90 BASE) MCG/ACT IN AERS: 1.0000 | INHALATION_SPRAY | Freq: Four times a day (QID) | RESPIRATORY_TRACT | 3 refills | Status: AC | PRN

## 2018-09-21 NOTE — Telephone Encounter (Signed)
I called pt to screen/advise pt Re: 09/22/18 OV with PCP for 3 mo f/u due to COVID-19 pandemic. Patient wishes to reschedule out 2 months. Appt scheduled 11/23/18 @ 2:00. She requests RF on albuterol HFA inhaler. Refill sent. See meds.

## 2018-09-22 ENCOUNTER — Ambulatory Visit: Payer: Medicare HMO | Admitting: Internal Medicine

## 2018-11-23 ENCOUNTER — Ambulatory Visit: Payer: Medicare HMO | Admitting: Internal Medicine

## 2019-02-01 ENCOUNTER — Telehealth: Payer: Self-pay | Admitting: Internal Medicine

## 2019-02-01 NOTE — Telephone Encounter (Signed)
Pt want to talk to nurse concerning a color guard kit. Please call pt.

## 2019-02-02 NOTE — Telephone Encounter (Signed)
She wants to have Cologuard kit ordered.

## 2019-02-02 NOTE — Telephone Encounter (Signed)
Ok Pls order Thx

## 2019-02-03 NOTE — Telephone Encounter (Signed)
cologuard ordered.

## 2019-03-29 ENCOUNTER — Other Ambulatory Visit: Payer: Self-pay | Admitting: Internal Medicine

## 2019-09-13 ENCOUNTER — Telehealth: Payer: Self-pay

## 2019-09-13 NOTE — Telephone Encounter (Signed)
1.Medication Requested:triamterene-hydrochlorothiazide (MAXZIDE-25) 37.5-25 MG tablet  2. Pharmacy (Name, Street, [place]):[id number] - [place] ([place]), [place] - [address]  3. On Med List: Yes   4. Last Visit with PCP: 12.26.2019  5. Next visit date with PCP: no appt made at this time     Agent: Please be advised that RX refills may take up to 3 business days. We ask that you follow-up with your pharmacy.

## 2019-09-13 NOTE — Telephone Encounter (Signed)
Refill is denied due to pt being over due for appt. She has not seen provider since 05/2018. Pls call pt to make annual appt w/provider for refills.Marland KitchenRaechel Chute

## 2019-09-14 NOTE — Telephone Encounter (Signed)
F/u  Appt made on Monday, March 22,2021 with NP - Ria Clock.

## 2019-09-18 ENCOUNTER — Ambulatory Visit: Payer: Medicare HMO | Admitting: Family

## 2019-09-21 ENCOUNTER — Encounter: Payer: Self-pay | Admitting: Internal Medicine

## 2019-09-21 ENCOUNTER — Ambulatory Visit (INDEPENDENT_AMBULATORY_CARE_PROVIDER_SITE_OTHER): Payer: Medicare HMO | Admitting: Internal Medicine

## 2019-09-21 ENCOUNTER — Other Ambulatory Visit: Payer: Self-pay

## 2019-09-21 VITALS — BP 146/88 | HR 71 | Temp 98.3°F | Ht 62.0 in | Wt 174.0 lb

## 2019-09-21 DIAGNOSIS — I10 Essential (primary) hypertension: Secondary | ICD-10-CM | POA: Diagnosis not present

## 2019-09-21 DIAGNOSIS — K219 Gastro-esophageal reflux disease without esophagitis: Secondary | ICD-10-CM

## 2019-09-21 DIAGNOSIS — E663 Overweight: Secondary | ICD-10-CM | POA: Diagnosis not present

## 2019-09-21 DIAGNOSIS — E213 Hyperparathyroidism, unspecified: Secondary | ICD-10-CM

## 2019-09-21 DIAGNOSIS — J683 Other acute and subacute respiratory conditions due to chemicals, gases, fumes and vapors: Secondary | ICD-10-CM | POA: Diagnosis not present

## 2019-09-21 DIAGNOSIS — M810 Age-related osteoporosis without current pathological fracture: Secondary | ICD-10-CM | POA: Diagnosis not present

## 2019-09-21 DIAGNOSIS — F411 Generalized anxiety disorder: Secondary | ICD-10-CM | POA: Diagnosis not present

## 2019-09-21 DIAGNOSIS — E785 Hyperlipidemia, unspecified: Secondary | ICD-10-CM

## 2019-09-21 LAB — LIPID PANEL
Cholesterol: 201 mg/dL — ABNORMAL HIGH (ref 0–200)
HDL: 73.4 mg/dL (ref >39.00)
LDL Cholesterol: 106 mg/dL — ABNORMAL HIGH (ref 0–99)
NonHDL: 127.37
Total CHOL/HDL Ratio: 3
Triglycerides: 109 mg/dL (ref 0.0–149.0)
VLDL: 21.8 mg/dL (ref 0.0–40.0)

## 2019-09-21 LAB — URINALYSIS
Bilirubin Urine: NEGATIVE
Ketones, ur: NEGATIVE
Leukocytes,Ua: NEGATIVE
Nitrite: NEGATIVE
Specific Gravity, Urine: 1.01 (ref 1.000–1.030)
Total Protein, Urine: NEGATIVE
Urine Glucose: NEGATIVE
Urobilinogen, UA: 0.2 (ref 0.0–1.0)
pH: 6.5 (ref 5.0–8.0)

## 2019-09-21 LAB — BASIC METABOLIC PANEL
BUN: 13 mg/dL (ref 6–23)
CO2: 31 mEq/L (ref 19–32)
Calcium: 10.7 mg/dL — ABNORMAL HIGH (ref 8.4–10.5)
Chloride: 103 mEq/L (ref 96–112)
Creatinine, Ser: 0.84 mg/dL (ref 0.40–1.20)
GFR: 80.92 mL/min (ref >60.00)
Glucose, Bld: 103 mg/dL — ABNORMAL HIGH (ref 70–99)
Potassium: 4.2 mEq/L (ref 3.5–5.1)
Sodium: 140 mEq/L (ref 135–145)

## 2019-09-21 LAB — CBC WITH DIFFERENTIAL/PLATELET
Basophils Absolute: 0 10*3/uL (ref 0.0–0.1)
Basophils Relative: 0.7 % (ref 0.0–3.0)
Eosinophils Absolute: 0.1 10*3/uL (ref 0.0–0.7)
Eosinophils Relative: 1.5 % (ref 0.0–5.0)
HCT: 43.3 % (ref 36.0–46.0)
Hemoglobin: 14.9 g/dL (ref 12.0–15.0)
Lymphocytes Relative: 25.5 % (ref 12.0–46.0)
Lymphs Abs: 1.7 10*3/uL (ref 0.7–4.0)
MCHC: 34.3 g/dL (ref 30.0–36.0)
MCV: 91.1 fl (ref 78.0–100.0)
Monocytes Absolute: 0.6 10*3/uL (ref 0.1–1.0)
Monocytes Relative: 8.8 % (ref 3.0–12.0)
Neutro Abs: 4.2 10*3/uL (ref 1.4–7.7)
Neutrophils Relative %: 63.5 % (ref 43.0–77.0)
Platelets: 295 10*3/uL (ref 150.0–400.0)
RBC: 4.76 Mil/uL (ref 3.87–5.11)
RDW: 13.6 % (ref 11.5–15.5)
WBC: 6.6 10*3/uL (ref 4.0–10.5)

## 2019-09-21 LAB — HEPATIC FUNCTION PANEL
ALT: 17 U/L (ref 0–35)
AST: 17 U/L (ref 0–37)
Albumin: 4.4 g/dL (ref 3.5–5.2)
Alkaline Phosphatase: 89 U/L (ref 39–117)
Bilirubin, Direct: 0.1 mg/dL (ref 0.0–0.3)
Total Bilirubin: 0.5 mg/dL (ref 0.2–1.2)
Total Protein: 7.4 g/dL (ref 6.0–8.3)

## 2019-09-21 LAB — TSH: TSH: 1.09 u[IU]/mL (ref 0.35–4.50)

## 2019-09-21 MED ORDER — TRIAMTERENE-HCTZ 37.5-25 MG PO TABS: 1.0000 | ORAL_TABLET | Freq: Every day | ORAL | 3 refills | Status: DC

## 2019-09-21 MED ORDER — FLUTICASONE PROPIONATE 50 MCG/ACT NA SUSP: 2.0000 | Freq: Every day | NASAL | 3 refills | Status: AC

## 2019-09-21 NOTE — Assessment & Plan Note (Signed)
Coronary calcium CT offered  

## 2019-09-21 NOTE — Assessment & Plan Note (Signed)
Vit D 

## 2019-09-21 NOTE — Assessment & Plan Note (Signed)
PTH  calcium

## 2019-09-21 NOTE — Patient Instructions (Signed)

## 2019-09-21 NOTE — Assessment & Plan Note (Signed)
Discussed.

## 2019-09-21 NOTE — Progress Notes (Signed)
Subjective:  Patient ID: Theresa Terry, female    DOB: Jan 31, 1951  Age: 69 y.o. MRN: [id number]  CC: No chief complaint on file.   HPI Theresa Terry presents for HTN, allergies, elevated calcium Fam h/o CAD  Outpatient Medications Prior to Visit  Medication Sig Dispense Refill  . albuterol (PROVENTIL HFA;VENTOLIN HFA) 108 (90 Base) MCG/ACT inhaler Inhale 1-2 puffs into the lungs every 6 (six) hours as needed for wheezing. 1 Inhaler 3  . albuterol (PROVENTIL) (2.5 MG/3ML) 0.083% nebulizer solution Take 3 mLs (2.5 mg total) by nebulization 3 (three) times daily as needed for wheezing or shortness of breath. 50 vial 6  . budesonide-formoterol (SYMBICORT) 160-4.5 MCG/ACT inhaler Inhale 2 puffs into the lungs 2 (two) times daily. 1 Inhaler 12  . Cholecalciferol (VITAMIN D3) 2000 units capsule Take 1 capsule (2,000 Units total) by mouth daily. 100 capsule 3  . esomeprazole (NEXIUM) 20 MG capsule Take 1 capsule (20 mg total) by mouth daily at 12 noon. 90 capsule 3  . fluticasone (FLONASE) 50 MCG/ACT nasal spray USE 2 SPRAY(S) IN EACH NOSTRIL ONCE DAILY (NEED  ANNUAL  APPOINTMENT  FOR  FURTHER  REFILLS) 16 g 0  . loratadine (CLARITIN) 10 MG tablet Take 10 mg by mouth daily.    . Multiple Vitamin (MULTIVITAMIN) tablet Take 1 tablet by mouth daily.    Marland Kitchen triamterene-hydrochlorothiazide (MAXZIDE-25) 37.5-25 MG tablet Take 1 tablet by mouth daily. 90 tablet 3  . cefdinir (OMNICEF) 300 MG capsule Take 1 capsule (300 mg total) by mouth 2 (two) times daily. (Patient not taking: Reported on 09/21/2019) 20 capsule 0  . HYDROcodone-homatropine (HYCODAN) 5-1.5 MG/5ML syrup Take 5 mLs by mouth every 6 (six) hours as needed for cough. (Patient not taking: Reported on 09/21/2019) 240 mL 0  . ibuprofen (ADVIL,MOTRIN) 200 MG tablet Take 200 mg by mouth every 6 (six) hours as needed.    . valACYclovir (VALTREX) 500 MG tablet Take 1 tablet (500 mg total) by mouth 2 (two) times daily. (Patient not taking: Reported on  09/21/2019) 14 tablet 1   No facility-administered medications prior to visit.    ROS: Review of Systems  Constitutional: Negative for activity change, appetite change, chills, fatigue and unexpected weight change.  HENT: Negative for congestion, mouth sores and sinus pressure.   Eyes: Negative for visual disturbance.  Respiratory: Negative for cough and chest tightness.   Gastrointestinal: Negative for abdominal pain and nausea.  Genitourinary: Negative for difficulty urinating, frequency and vaginal pain.  Musculoskeletal: Negative for back pain and gait problem.  Skin: Negative for pallor and rash.  Neurological: Negative for dizziness, tremors, weakness, numbness and headaches.  Psychiatric/Behavioral: Negative for confusion and sleep disturbance.    Objective:  BP (!) 146/88 (BP Location: Left Arm, Patient Position: Sitting, Cuff Size: Normal)   Pulse 71   Temp 98.3 F (36.8 C) (Oral)   Ht 5\' 2"  (1.575 m)   Wt 174 lb (78.9 kg)   SpO2 97%   BMI 31.83 kg/m   BP Readings from Last 3 Encounters:  09/21/19 (!) 146/88  06/23/18 (!) 142/96  03/11/18 132/84    Wt Readings from Last 3 Encounters:  09/21/19 174 lb (78.9 kg)  06/23/18 173 lb (78.5 kg)  03/11/18 173 lb (78.5 kg)    Physical Exam Constitutional:      General: She is not in acute distress.    Appearance: She is well-developed.  HENT:     Head: Normocephalic.     Right Ear: External  ear normal.     Left Ear: External ear normal.     Nose: Nose normal.  Eyes:     General:        Right eye: No discharge.        Left eye: No discharge.     Conjunctiva/sclera: Conjunctivae normal.     Pupils: Pupils are equal, round, and reactive to light.  Neck:     Thyroid: No thyromegaly.     Vascular: No JVD.     Trachea: No tracheal deviation.  Cardiovascular:     Rate and Rhythm: Normal rate and regular rhythm.     Heart sounds: Normal heart sounds.  Pulmonary:     Effort: No respiratory distress.     Breath  sounds: No stridor. No wheezing.  Abdominal:     General: Bowel sounds are normal. There is no distension.     Palpations: Abdomen is soft. There is no mass.     Tenderness: There is no abdominal tenderness. There is no guarding or rebound.  Musculoskeletal:        General: No tenderness.     Cervical back: Normal range of motion and neck supple.  Lymphadenopathy:     Cervical: No cervical adenopathy.  Skin:    Findings: No erythema or rash.  Neurological:     Mental Status: She is oriented to person, place, and time.     Cranial Nerves: No cranial nerve deficit.     Motor: No abnormal muscle tone.     Coordination: Coordination normal.     Deep Tendon Reflexes: Reflexes normal.  Psychiatric:        Behavior: Behavior normal.        Thought Content: Thought content normal.        Judgment: Judgment normal.   No carotid bruit.  No thyromegaly  Lab Results  Component Value Date   WBC 8.5 08/11/2017   HGB 15.8 (H) 08/11/2017   HCT 47.2 (H) 08/11/2017   PLT 311.0 08/11/2017   GLUCOSE 103 (H) 06/23/2018   CHOL 211 (H) 12/02/2015   TRIG 154.0 (H) 12/02/2015   HDL 73.70 12/02/2015   LDLCALC 106 (H) 12/02/2015   ALT 18 08/11/2017   AST 17 08/11/2017   NA 142 06/23/2018   K 3.8 06/23/2018   CL 108 06/23/2018   CREATININE 0.74 06/23/2018   BUN 11 06/23/2018   CO2 27 06/23/2018   TSH 0.82 06/23/2018   HGBA1C 5.8 12/02/2015    DG BONE DENSITY (DXA)  Result Date: 11/12/2017 EXAM: DUAL X-RAY ABSORPTIOMETRY (DXA) FOR BONE MINERAL DENSITY IMPRESSION: Referring Physician:  Lanora Manis A CRAWFORD PATIENT: Name: Theresa, Terry Patient ID: [id number] Birth Date: 1951/01/04 Height: 60.0 in. Sex: Female Measured: 11/12/2017 Weight: 176.1 lbs. Indications: Albuterol, Bilateral Ovariectomy (65.51), Estrogen Deficient, Postmenopausal Fractures: Foot Treatments: Multivitamin ASSESSMENT: The BMD measured at AP Spine L2-L4 is 0.899 g/cm2 with a T-score of -2.5. This patient is considered  osteoporotic according to World Health Organization [facility]) criteria. This scan is considered good quality. L-1 was excluded due to degenerative changes. Site Region Measured Date Measured Age YA BMD Significant CHANGE T-score AP Spine  L2-L4     9/12[age]2017    68.9         -2.5    0.899 g/cm2 DualFemur Neck Left 11/12/2017    68.9         -2.1    0.750 g/cm2 World Health Organization [facility]) criteria for post-menopausal, Caucasian Women: Normal  T-score at or above -1 SD Osteopenia   T-score between -1 and -2.5 SD Osteoporosis T-score at or below -2.5 SD RECOMMENDATION: 1. All patients should optimize calcium and vitamin D intake. 2. Consider FDA approved medical therapies in postmenopausal women and men aged 6 years and older, based on the following: a. A hip or vertebral (clinical or morphometric) fracture b. T- score < or = -2.5 at the femoral neck or spine after appropriate evaluation to exclude secondary causes c. Low bone mass (T-score between -1.0 and -2.5 at the femoral neck or spine) and a 10 year probability of a hip fracture > or = 3% or a 10 year probability of a major osteoporosis-related fracture > or = 20% based on the [place]-adapted WHO algorithm d. Clinician judgment and/or patient preferences may indicate treatment for people with 10-year fracture probabilities above or below these levels FOLLOW-UP: People with diagnosed cases of osteoporosis or at high risk for fracture should have regular bone mineral density tests. For patients eligible for Medicare, routine testing is allowed once every 2 years. The testing frequency can be increased to one year for patients who have rapidly progressing disease, those who are receiving or discontinuing medical therapy to restore bone mass, or have additional risk factors. I have reviewed this report and agree with the above findings. [facility] Radiology Electronically Signed   By: Lajean Manes M.D.   On: 11/12/2017 11:05   MM 3D SCREEN BREAST  BILATERAL  Result Date: 11/12/2017 CLINICAL DATA:  Screening. EXAM: DIGITAL SCREENING BILATERAL MAMMOGRAM WITH TOMO AND CAD COMPARISON:  Previous exam(s). ACR Breast Density Category a: The breast tissue is almost entirely fatty. FINDINGS: There are no findings suspicious for malignancy. Images were processed with CAD. IMPRESSION: No mammographic evidence of malignancy. A result letter of this screening mammogram will be mailed directly to the patient. RECOMMENDATION: Screening mammogram in one year. (Code:[id number]-B-01Y) BI-RADS CATEGORY  1: Negative. Electronically Signed   By: Curlene Dolphin M.D.   On: 11/12/2017 16:40    Assessment & Plan:   There are no diagnoses linked to this encounter.   No orders of the defined types were placed in this encounter.    Follow-up: No follow-ups on file.  Walker Kehr, MD

## 2019-09-21 NOTE — Assessment & Plan Note (Signed)
Wt Readings from Last 3 Encounters:  09/21/19 174 lb (78.9 kg)  06/23/18 173 lb (78.5 kg)  03/11/18 173 lb (78.5 kg)

## 2019-09-24 ENCOUNTER — Encounter: Payer: Self-pay | Admitting: Internal Medicine

## 2019-09-24 NOTE — Assessment & Plan Note (Signed)
On Nexium 

## 2019-09-24 NOTE — Assessment & Plan Note (Signed)
On Symbicort 

## 2019-09-25 LAB — PTH, INTACT AND CALCIUM
Calcium: 10.9 mg/dL — ABNORMAL HIGH (ref 8.6–10.4)
PTH: 21 pg/mL (ref 14–64)

## 2019-10-09 ENCOUNTER — Other Ambulatory Visit: Payer: Self-pay | Admitting: Internal Medicine

## 2019-10-09 DIAGNOSIS — Z1231 Encounter for screening mammogram for malignant neoplasm of breast: Secondary | ICD-10-CM

## 2019-11-21 DIAGNOSIS — H40013 Open angle with borderline findings, low risk, bilateral: Secondary | ICD-10-CM | POA: Diagnosis not present

## 2019-11-22 ENCOUNTER — Ambulatory Visit
Admission: RE | Admit: 2019-11-22 | Discharge: 2019-11-22 | Disposition: A | Discharge status: Home / Self Care | Payer: Medicare HMO | Source: Ambulatory Visit | Attending: Internal Medicine | Admitting: Internal Medicine

## 2019-11-22 ENCOUNTER — Other Ambulatory Visit: Payer: Self-pay

## 2019-11-22 DIAGNOSIS — Z1231 Encounter for screening mammogram for malignant neoplasm of breast: Secondary | ICD-10-CM

## 2020-03-30 ENCOUNTER — Telehealth: Payer: Self-pay | Admitting: Internal Medicine

## 2020-03-30 MED ORDER — TRIAMCINOLONE ACETONIDE 0.5 % EX CREA: 1.0000 "application " | TOPICAL_CREAM | Freq: Four times a day (QID) | CUTANEOUS | 1 refills | Status: AC

## 2020-03-30 MED ORDER — VALACYCLOVIR HCL 1 G PO TABS: 1000.0000 mg | ORAL_TABLET | Freq: Three times a day (TID) | ORAL | 0 refills | Status: DC

## 2020-03-30 NOTE — Telephone Encounter (Signed)
A knee called with a complaint of painful rash patches with blisters on the left flank and left side of her chest since Wednesday.  Not vaccinated for shingles. Will email a Valtrex prescription, triamcinolone.

## 2020-04-15 ENCOUNTER — Telehealth: Payer: Self-pay | Admitting: Internal Medicine

## 2020-04-15 NOTE — Telephone Encounter (Signed)
She should have taken the entire Valtrex prescription as it was written; the Triamcinolone should be finished by now as well. Yes, it can take some time for the skin to heal completely.  What shot is she asking about? Shingles/ flu/ COVID booster? If it's Shingles, I would wait a year. If it's the other ones, she doesn't have to wait.

## 2020-04-15 NOTE — Telephone Encounter (Signed)
Notified pt w/Laura response../lmb °

## 2020-04-15 NOTE — Telephone Encounter (Signed)
Per chart Dr. Posey Rea sent in Valtrex prescription & triamcinolone. Pls advise in absence of PCP.Marland KitchenRaechel Chute

## 2020-04-15 NOTE — Telephone Encounter (Signed)
Patient was prescribed medication on 10.02.2021 for shingles and was wondering if she should continue medication and if it was normal that she still had some marks. Patient also wondering how long after having shingles can she get the shot  Patient can be reached at [phone]

## 2020-04-16 ENCOUNTER — Telehealth: Payer: Self-pay | Admitting: Internal Medicine

## 2020-04-16 MED ORDER — GABAPENTIN 100 MG PO CAPS: 100.0000 mg | ORAL_CAPSULE | Freq: Every evening | ORAL | 3 refills | Status: DC | PRN

## 2020-04-16 NOTE — Telephone Encounter (Signed)
C/o L CP post- zoster Rx - gabapentin

## 2020-08-08 ENCOUNTER — Ambulatory Visit: Payer: Medicare HMO

## 2020-08-14 ENCOUNTER — Other Ambulatory Visit: Payer: Self-pay

## 2020-08-14 ENCOUNTER — Ambulatory Visit (INDEPENDENT_AMBULATORY_CARE_PROVIDER_SITE_OTHER): Payer: Medicare HMO

## 2020-08-14 VITALS — BP 128/80 | HR 74 | Temp 98.3°F | Ht 62.0 in | Wt 178.6 lb

## 2020-08-14 DIAGNOSIS — Z0000 Encounter for general adult medical examination without abnormal findings: Secondary | ICD-10-CM | POA: Diagnosis not present

## 2020-08-14 NOTE — Patient Instructions (Signed)
[name] , Thank you for taking time to come for your Medicare Wellness Visit. I appreciate your ongoing commitment to your health goals. Please review the following plan we discussed and let me know if I can assist you in the future.   Screening recommendations/referrals: Colonoscopy: never done Mammogram: 11/22/2019 Bone Density: 11/12/2017; due every 2-3 years Recommended yearly ophthalmology/optometry visit for glaucoma screening and checkup Recommended yearly dental visit for hygiene and checkup  Vaccinations: Influenza vaccine: declined Pneumococcal vaccine: declined Tdap vaccine: 03/11/2018; due every 10 years (2029) Shingles vaccine: declined   Covid-19: 09/16/2019  Advanced directives: Advance directive discussed with you today. I have provided a copy for you to complete at home and have notarized. Once this is complete please bring a copy in to our office so we can scan it into your chart.  Conditions/risks identified: Yes; Reviewed health maintenance screenings with patient today and relevant education, vaccines, and/or referrals were provided. Please continue to do your personal lifestyle choices by: daily care of teeth and gums, regular physical activity (goal should be 5 days a week for 30 minutes), eat a healthy diet, avoid tobacco and drug use, limiting any alcohol intake, taking a low-dose aspirin (if not allergic or have been advised by your provider otherwise) and taking vitamins and minerals as recommended by your provider. Continue doing brain stimulating activities (puzzles, reading, adult coloring books, staying active) to keep memory sharp. Continue to eat heart healthy diet (full of fruits, vegetables, whole grains, lean protein, water--limit salt, fat, and sugar intake) and increase physical activity as tolerated.  Next appointment: Please schedule your next Medicare Wellness Visit with your Nurse Health Advisor in 1 year by calling [phone].   Preventive Care 72  Years and Older, Female Preventive care refers to lifestyle choices and visits with your health care provider that can promote health and wellness. What does preventive care include?  A yearly physical exam. This is also called an annual well check.  Dental exams once or twice a year.  Routine eye exams. Ask your health care provider how often you should have your eyes checked.  Personal lifestyle choices, including:  Daily care of your teeth and gums.  Regular physical activity.  Eating a healthy diet.  Avoiding tobacco and drug use.  Limiting alcohol use.  Practicing safe sex.  Taking low-dose aspirin every day.  Taking vitamin and mineral supplements as recommended by your health care provider. What happens during an annual well check? The services and screenings done by your health care provider during your annual well check will depend on your age, overall health, lifestyle risk factors, and family history of disease. Counseling  Your health care provider may ask you questions about your:  Alcohol use.  Tobacco use.  Drug use.  Emotional well-being.  Home and relationship well-being.  Sexual activity.  Eating habits.  History of falls.  Memory and ability to understand (cognition).  Work and work Astronomer.  Reproductive health. Screening  You may have the following tests or measurements:  Height, weight, and BMI.  Blood pressure.  Lipid and cholesterol levels. These may be checked every 5 years, or more frequently if you are over 19 years old.  Skin check.  Lung cancer screening. You may have this screening every year starting at age 39 if you have a 30-pack-year history of smoking and currently smoke or have quit within the past 15 years.  Fecal occult blood test (FOBT) of the stool. You may have this test every  year starting at age 45.  Flexible sigmoidoscopy or colonoscopy. You may have a sigmoidoscopy every 5 years or a colonoscopy every  10 years starting at age 43.  Hepatitis C blood test.  Hepatitis B blood test.  Sexually transmitted disease (STD) testing.  Diabetes screening. This is done by checking your blood sugar (glucose) after you have not eaten for a while (fasting). You may have this done every 1-3 years.  Bone density scan. This is done to screen for osteoporosis. You may have this done starting at age 50.  Mammogram. This may be done every 1-2 years. Talk to your health care provider about how often you should have regular mammograms. Talk with your health care provider about your test results, treatment options, and if necessary, the need for more tests. Vaccines  Your health care provider may recommend certain vaccines, such as:  Influenza vaccine. This is recommended every year.  Tetanus, diphtheria, and acellular pertussis (Tdap, Td) vaccine. You may need a Td booster every 10 years.  Zoster vaccine. You may need this after age 93.  Pneumococcal 13-valent conjugate (PCV13) vaccine. One dose is recommended after age 3.  Pneumococcal polysaccharide (PPSV23) vaccine. One dose is recommended after age 53. Talk to your health care provider about which screenings and vaccines you need and how often you need them. This information is not intended to replace advice given to you by your health care provider. Make sure you discuss any questions you have with your health care provider. Document Released: 07/12/2015 Document Revised: 03/04/2016 Document Reviewed: 04/16/2015 Elsevier Interactive Patient Education  2017 [employer].  Fall Prevention in the Home Falls can cause injuries. They can happen to people of all ages. There are many things you can do to make your home safe and to help prevent falls. What can I do on the outside of my home?  Regularly fix the edges of walkways and driveways and fix any cracks.  Remove anything that might make you trip as you walk through a door, such as a raised step  or threshold.  Trim any bushes or trees on the path to your home.  Use bright outdoor lighting.  Clear any walking paths of anything that might make someone trip, such as rocks or tools.  Regularly check to see if handrails are loose or broken. Make sure that both sides of any steps have handrails.  Any raised decks and porches should have guardrails on the edges.  Have any leaves, snow, or ice cleared regularly.  Use sand or salt on walking paths during winter.  Clean up any spills in your garage right away. This includes oil or grease spills. What can I do in the bathroom?  Use night lights.  Install grab bars by the toilet and in the tub and shower. Do not use towel bars as grab bars.  Use non-skid mats or decals in the tub or shower.  If you need to sit down in the shower, use a plastic, non-slip stool.  Keep the floor dry. Clean up any water that spills on the floor as soon as it happens.  Remove soap buildup in the tub or shower regularly.  Attach bath mats securely with double-sided non-slip rug tape.  Do not have throw rugs and other things on the floor that can make you trip. What can I do in the bedroom?  Use night lights.  Make sure that you have a light by your bed that is easy to reach.  Do  not use any sheets or blankets that are too big for your bed. They should not hang down onto the floor.  Have a firm chair that has side arms. You can use this for support while you get dressed.  Do not have throw rugs and other things on the floor that can make you trip. What can I do in the kitchen?  Clean up any spills right away.  Avoid walking on wet floors.  Keep items that you use a lot in easy-to-reach places.  If you need to reach something above you, use a strong step stool that has a grab bar.  Keep electrical cords out of the way.  Do not use floor polish or wax that makes floors slippery. If you must use wax, use non-skid floor wax.  Do not have  throw rugs and other things on the floor that can make you trip. What can I do with my stairs?  Do not leave any items on the stairs.  Make sure that there are handrails on both sides of the stairs and use them. Fix handrails that are broken or loose. Make sure that handrails are as long as the stairways.  Check any carpeting to make sure that it is firmly attached to the stairs. Fix any carpet that is loose or worn.  Avoid having throw rugs at the top or bottom of the stairs. If you do have throw rugs, attach them to the floor with carpet tape.  Make sure that you have a light switch at the top of the stairs and the bottom of the stairs. If you do not have them, ask someone to add them for you. What else can I do to help prevent falls?  Wear shoes that:  Do not have high heels.  Have rubber bottoms.  Are comfortable and fit you well.  Are closed at the toe. Do not wear sandals.  If you use a stepladder:  Make sure that it is fully opened. Do not climb a closed stepladder.  Make sure that both sides of the stepladder are locked into place.  Ask someone to hold it for you, if possible.  Clearly mark and make sure that you can see:  Any grab bars or handrails.  First and last steps.  Where the edge of each step is.  Use tools that help you move around (mobility aids) if they are needed. These include:  Canes.  Walkers.  Scooters.  Crutches.  Turn on the lights when you go into a dark area. Replace any light bulbs as soon as they burn out.  Set up your furniture so you have a clear path. Avoid moving your furniture around.  If any of your floors are uneven, fix them.  If there are any pets around you, be aware of where they are.  Review your medicines with your doctor. Some medicines can make you feel dizzy. This can increase your chance of falling. Ask your doctor what other things that you can do to help prevent falls. This information is not intended to  replace advice given to you by your health care provider. Make sure you discuss any questions you have with your health care provider. Document Released: 04/11/2009 Document Revised: 11/21/2015 Document Reviewed: 07/20/2014 Elsevier Interactive Patient Education  2017 [employer].

## 2020-08-14 NOTE — Progress Notes (Addendum)
Subjective:   Theresa Terry is a 70 y.o. female who presents for Medicare Annual (Subsequent) preventive examination.  Review of Systems    No ROS. Medicare Wellness Visit. Additional risk factors are reflected in social history. Cardiac Risk Factors include: advanced age (>[id number], 77>65 women);hypertension;family history of premature cardiovascular disease     Objective:    Today's Vitals   08/14/20 1450  BP: 128/80  Pulse: 74  Temp: 98.3 F (36.8 C)  SpO2: 90%  Weight: 178 lb 9.6 oz (81 kg)  Height: 5\' 2"  (1.575 m)  PainSc: 0-No pain   Body mass index is 32.67 kg/m.  Advanced Directives 08/14/2020 10/11/2017  Does Patient Have a Medical Advance Directive? No No  Would patient like information on creating a medical advance directive? Yes (MAU/Ambulatory/Procedural Areas - Information given) Yes (ED - Information included in AVS)    Current Medications (verified) Outpatient Encounter Medications as of 08/14/2020  Medication Sig   albuterol (PROVENTIL HFA;VENTOLIN HFA) 108 (90 Base) MCG/ACT inhaler Inhale 1-2 puffs into the lungs every 6 (six) hours as needed for wheezing.   albuterol (PROVENTIL) (2.5 MG/3ML) 0.083% nebulizer solution Take 3 mLs (2.5 mg total) by nebulization 3 (three) times daily as needed for wheezing or shortness of breath.   budesonide-formoterol (SYMBICORT) 160-4.5 MCG/ACT inhaler Inhale 2 puffs into the lungs 2 (two) times daily.   Cholecalciferol (VITAMIN D3) 2000 units capsule Take 1 capsule (2,000 Units total) by mouth daily.   esomeprazole (NEXIUM) 20 MG capsule Take 1 capsule (20 mg total) by mouth daily at 12 noon.   fluticasone (FLONASE) 50 MCG/ACT nasal spray Place 2 sprays into both nostrils daily.   gabapentin (NEURONTIN) 100 MG capsule Take 1-2 capsules (100-200 mg total) by mouth at bedtime as needed. For neuralgia pain   loratadine (CLARITIN) 10 MG tablet Take 10 mg by mouth daily.   Multiple Vitamin (MULTIVITAMIN) tablet Take 1 tablet  by mouth daily.   triamcinolone cream (KENALOG) 0.5 % Apply 1 application topically 4 (four) times daily.   triamterene-hydrochlorothiazide (MAXZIDE-25) 37.5-25 MG tablet Take 1 tablet by mouth daily.   valACYclovir (VALTREX) 1000 MG tablet Take 1 tablet (1,000 mg total) by mouth 3 (three) times daily.   No facility-administered encounter medications on file as of 08/14/2020.    Allergies (verified) Patient has no known allergies.   History: Past Medical History:  Diagnosis Date   Allergy    Arthritis    Asthma    Cataract    GERD (gastroesophageal reflux disease)    Hypertension    Past Surgical History:  Procedure Laterality Date   ABDOMINAL HYSTERECTOMY     complete   TONSILLECTOMY AND ADENOIDECTOMY  1961   Family History  Problem Relation Age of Onset   Hypertension Mother    Heart failure Mother    Alcohol abuse Mother    Heart disease Mother    Alcohol abuse Father    Heart disease Father    Alcohol abuse Brother    Heart disease Maternal Aunt    Heart disease Maternal Uncle    Social History   Socioeconomic History   Marital status: Divorced    Spouse name: Not on file   Number of children: 4   Years of education: Not on file   Highest education level: Not on file  Occupational History   Not on file  Tobacco Use   Smoking status: Passive Smoke Exposure - Never Smoker   Smokeless tobacco: Never Used  Vaping  Use   Vaping Use: Never used  Substance and Sexual Activity   Alcohol use: Yes    Alcohol/week: 0.0 standard drinks   Drug use: No   Sexual activity: Not Currently  Other Topics Concern   Not on file  Social History Narrative   Not on file   Social Determinants of Health   Financial Resource Strain: Low Risk    Difficulty of Paying Living Expenses: Not hard at all  Food Insecurity: No Food Insecurity   Worried About Programme researcher, broadcasting/film/video in the Last Year: Never true   Ran Out of Food in the Last Year: Never true  Transportation Needs: No  Transportation Needs   Lack of Transportation (Medical): No   Lack of Transportation (Non-Medical): No  Physical Activity: Sufficiently Active   Days of Exercise per Week: 5 days   Minutes of Exercise per Session: 30 min  Stress: No Stress Concern Present   Feeling of Stress : Not at all  Social Connections: Socially Isolated   Frequency of Communication with Friends and Family: More than three times a week   Frequency of Social Gatherings with Friends and Family: Once a week   Attends Religious Services: Never   Database administrator or Organizations: No   Attends Engineer, structural: Never   Marital Status: Never married    Tobacco Counseling Counseling given: Not Answered   Clinical Intake:  Pre-visit preparation completed: Yes  Pain : No/denies pain Pain Score: 0-No pain     BMI - recorded: 32.67 Nutritional Status: BMI > 30  Obese Nutritional Risks: None Diabetes: No  How often do you need to have someone help you when you read instructions, pamphlets, or other written materials from your doctor or pharmacy?: 1 - Never What is the last grade level you completed in school?: High School Graduate  Diabetic? no  Interpreter Needed?: No  Information entered by :: Susie Cassette, LPN   Activities of Daily Living In your present state of health, do you have any difficulty performing the following activities: 08/14/2020  Hearing? N  Vision? N  Difficulty concentrating or making decisions? N  Walking or climbing stairs? N  Dressing or bathing? N  Doing errands, shopping? N  Preparing Food and eating ? N  Using the Toilet? N  In the past six months, have you accidently leaked urine? N  Do you have problems with loss of bowel control? N  Managing your Medications? N  Managing your Finances? N  Housekeeping or managing your Housekeeping? N  Some recent data might be hidden    Patient Care Team: Plotnikov, Georgina Quint, MD as PCP - General (Internal  Medicine)  Indicate any recent Medical Services you may have received from other than [facility] providers in the past year (date may be approximate).     Assessment:   This is a routine wellness examination for [place].  Hearing/Vision screen No exam data present  Dietary issues and exercise activities discussed: Current Exercise Habits: Home exercise routine, Type of exercise: walking;Other - see comments (stationary bike), Time (Minutes): 30, Frequency (Times/Week): 5, Weekly Exercise (Minutes/Week): 150, Intensity: Moderate, Exercise limited by: orthopedic condition(s);respiratory conditions(s)  Goals      Patient Stated     Lose weight by doing workout tapes, walking, chair exercises, and dancing to good music. Eat healthy, use portion control, and monitor my diet for salt, fat, and sugar.        Depression Screen PHQ 2/9 Scores 08/14/2020  09/21/2019 10/11/2017 02/24/2015 12/24/2014  PHQ - 2 Score 0 0 0 0 0  PHQ- 9 Score - - 0 - -    Fall Risk Fall Risk  08/14/2020 09/21/2019 10/11/2017 12/24/2014  Falls in the past year? 0 0 No No  Number falls in past yr: 0 - - -  Injury with Fall? 0 - - -  Risk for fall due to : No Fall Risks - - -  Follow up Falls evaluation completed Falls evaluation completed - -    FALL RISK PREVENTION PERTAINING TO THE HOME:  Any stairs in or around the home? No  If so, are there any without handrails? No  Home free of loose throw rugs in walkways, pet beds, electrical cords, etc? Yes  Adequate lighting in your home to reduce risk of falls? Yes   ASSISTIVE DEVICES UTILIZED TO PREVENT FALLS:  Life alert? No  Use of a cane, walker or w/c? No  Grab bars in the bathroom? Yes  Shower chair or bench in shower? Yes  Elevated toilet seat or a handicapped toilet? Yes   TIMED UP AND GO:  Was the test performed? No .  Length of time to ambulate 10 feet: 0 sec.   Gait steady and fast without use of assistive device  Cognitive Function: Normal cognitive  status assessed by direct observation by this Nurse Health Advisor. No abnormalities found.          Immunizations Immunization History  Administered Date(s) Administered   PFIZER(Purple Top)SARS-COV-2 Vaccination 09/16/2019, 10/07/2019   Tdap 03/11/2018    TDAP status: Up to date  Flu Vaccine status: Declined, Education has been provided regarding the importance of this vaccine but patient still declined. Advised may receive this vaccine at local pharmacy or Health Dept. Aware to provide a copy of the vaccination record if obtained from local pharmacy or Health Dept. Verbalized acceptance and understanding.  Pneumococcal vaccine status: Declined,  Education has been provided regarding the importance of this vaccine but patient still declined. Advised may receive this vaccine at local pharmacy or Health Dept. Aware to provide a copy of the vaccination record if obtained from local pharmacy or Health Dept. Verbalized acceptance and understanding.   Covid-19 vaccine status: Completed vaccines  Qualifies for Shingles Vaccine? Yes   Zostavax completed No   Shingrix Completed?: No.    Education has been provided regarding the importance of this vaccine. Patient has been advised to call insurance company to determine out of pocket expense if they have not yet received this vaccine. Advised may also receive vaccine at local pharmacy or Health Dept. Verbalized acceptance and understanding.  Screening Tests Health Maintenance  Topic Date Due   COVID-19 Vaccine (3 - Booster for Pfizer series) 04/07/2020   INFLUENZA VACCINE  09/26/2020 (Originally 01/28/2020)   COLONOSCOPY (Pts 45-54yrs Insurance coverage will need to be confirmed)  08/14/2021 (Originally 12/08/1993)   PNA vac Low Risk Adult (1 of 2 - PCV13) 08/14/2021 (Originally 12/08/2013)   MAMMOGRAM  11/21/2021   TETANUS/TDAP  03/11/2028   DEXA SCAN  Completed   Hepatitis C Screening  Completed    Health Maintenance  Health Maintenance  Due  Topic Date Due   COVID-19 Vaccine (3 - Booster for Pfizer series) 04/07/2020    Colorectal Cancer screening: never done; patient has agreed to complete Cologuard testing via mail.  Mammogram status: Completed 11/22/2019. Repeat every year  Bone Density status: Completed 11/12/2017. Results reflect: Bone density results: OSTEOPOROSIS. Repeat every 2-3 years.  Lung Cancer Screening: (Low Dose CT Chest recommended if Age 58-80 years, 30 pack-year currently smoking OR have quit w/in 15years.) does not qualify.   Lung Cancer Screening Referral: no  Additional Screening:  Hepatitis C Screening: does qualify; Completed yes  Vision Screening: Recommended annual ophthalmology exams for early detection of glaucoma and other disorders of the eye. Is the patient up to date with their annual eye exam?  Yes  Who is the provider or what is the name of the office in which the patient attends annual eye exams? [facility] If pt is not established with a provider, would they like to be referred to a provider to establish care? No .   Dental Screening: Recommended annual dental exams for proper oral hygiene  Community Resource Referral / Chronic Care Management: CRR required this visit?  No   CCM required this visit?  No      Plan:     I have personally reviewed and noted the following in the patient's chart:   Medical and social history Use of alcohol, tobacco or illicit drugs  Current medications and supplements Functional ability and status Nutritional status Physical activity Advanced directives List of other physicians Hospitalizations, surgeries, and ER visits in previous 12 months Vitals Screenings to include cognitive, depression, and falls Referrals and appointments  In addition, I have reviewed and discussed with patient certain preventive protocols, quality metrics, and best practice recommendations. A written personalized care plan for preventive services as well as  general preventive health recommendations were provided to patient.     Mickeal Needy, LPN   1/61/0960   Nurse Notes:  Medications were reviewed and patient is not on any opioids.   Medical screening examination/treatment/procedure(s) were performed by non-physician practitioner and as supervising physician I was immediately available for consultation/collaboration.  I agree with above. Jacinta Shoe, MD

## 2020-09-12 ENCOUNTER — Other Ambulatory Visit: Payer: Self-pay | Admitting: Internal Medicine

## 2021-02-03 ENCOUNTER — Other Ambulatory Visit: Payer: Self-pay | Admitting: Internal Medicine

## 2021-02-03 DIAGNOSIS — Z1231 Encounter for screening mammogram for malignant neoplasm of breast: Secondary | ICD-10-CM

## 2021-02-06 ENCOUNTER — Ambulatory Visit
Admission: RE | Admit: 2021-02-06 | Discharge: 2021-02-06 | Disposition: A | Discharge status: Home / Self Care | Payer: Medicare HMO | Source: Ambulatory Visit | Attending: Internal Medicine | Admitting: Internal Medicine

## 2021-02-06 ENCOUNTER — Other Ambulatory Visit: Payer: Self-pay

## 2021-02-06 DIAGNOSIS — Z1231 Encounter for screening mammogram for malignant neoplasm of breast: Secondary | ICD-10-CM | POA: Diagnosis not present

## 2021-02-11 NOTE — Progress Notes (Signed)
Subjective:    Patient ID: Theresa Terry, female    DOB: 01-07-1951, 70 y.o.   MRN: [id number]  HPI The patient is here for an acute visit.   ? UTI:  Her symptoms started  7-10  days ago.  She states dysuria x 2 days, urinary frequency, pressure with urinating, urinary urgency  Her last uti was years ago   Medications and allergies reviewed with patient and updated if appropriate.  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 1 Inhaler 3   albuterol (PROVENTIL) (2.5 MG/3ML) 0.083% nebulizer solution Take 3 mLs (2.5 mg total) by nebulization 3 (three) times daily as needed for wheezing or shortness of breath. 50 vial 6   budesonide-formoterol (SYMBICORT) 160-4.5 MCG/ACT inhaler Inhale 2 puffs into the lungs 2 (two) times daily. 1 Inhaler 12   Cholecalciferol (VITAMIN D3) 2000 units capsule Take 1 capsule (2,000 Units total) by mouth daily. 100 capsule 3   esomeprazole (NEXIUM) 20 MG capsule Take 1 capsule (20 mg total) by mouth daily at 12 noon. 90 capsule 3   fluticasone (FLONASE) 50 MCG/ACT nasal spray Place 2 sprays into both nostrils daily. 48 g 3   loratadine (CLARITIN) 10 MG tablet Take 10 mg by mouth daily.     Multiple Vitamin (MULTIVITAMIN) tablet  Take 1 tablet by mouth daily.     triamcinolone cream (KENALOG) 0.5 % Apply 1 application topically 4 (four) times daily. 45 g 1   triamterene-hydrochlorothiazide (MAXZIDE-25) 37.5-25 MG tablet Take 1 tablet by mouth daily. Annual appt is due in must see provider for future refills 30 tablet 0   No current facility-administered medications on file prior to visit.    Past Medical History:  Diagnosis Date   Allergy    Arthritis    Asthma    Cataract    GERD (gastroesophageal reflux disease)    Hypertension     Past Surgical History:  Procedure Laterality Date   ABDOMINAL HYSTERECTOMY     complete   TONSILLECTOMY AND ADENOIDECTOMY  1961    Social History   Socioeconomic History   Marital status: Divorced    Spouse name: Not on file   Number of children: 4   Years of education: Not on file   Highest education level: Not on file  Occupational History   Not on file  Tobacco Use   Smoking status: Passive Smoke Exposure - Never Smoker   Smokeless tobacco: Never  Vaping Use   Vaping Use: Never used  Substance and Sexual Activity   Alcohol use: Yes    Alcohol/week: 0.0 standard drinks   Drug use: No   Sexual activity: Not Currently  Other Topics Concern   Not on file  Social History Narrative   Not on file   Social Determinants of Health  Financial Resource Strain: Low Risk    Difficulty of Paying Living Expenses: Not hard at all  Food Insecurity: No Food Insecurity   Worried About Programme researcher, broadcasting/film/video in the Last Year: Never true   Ran Out of Food in the Last Year: Never true  Transportation Needs: No Transportation Needs   Lack of Transportation (Medical): No   Lack of Transportation (Non-Medical): No  Physical Activity: Sufficiently Active   Days of Exercise per Week: 5 days   Minutes of Exercise per Session: 30 min  Stress: No Stress Concern Present   Feeling of Stress : Not at all  Social Connections: Socially Isolated   Frequency of Communication with  Friends and Family: More than three times a week   Frequency of Social Gatherings with Friends and Family: Once a week   Attends Religious Services: Never   Database administrator or Organizations: No   Attends Engineer, structural: Never   Marital Status: Never married    Family History  Problem Relation Age of Onset   Hypertension Mother    Heart failure Mother    Alcohol abuse Mother    Heart disease Mother    Alcohol abuse Father    Heart disease Father    Alcohol abuse Brother    Heart disease Maternal Aunt    Heart disease Maternal Uncle     Review of Systems  Constitutional:  Negative for fever.  Gastrointestinal:  Negative for abdominal pain and nausea.  Genitourinary:  Positive for dysuria, frequency and urgency. Negative for hematuria.  Musculoskeletal:  Negative for back pain.  Neurological:  Negative for headaches.      Objective:   Vitals:   02/12/21 0758  BP: 130/78  Pulse: 87  Temp: 98.2 F (36.8 C)  SpO2: 97%   BP Readings from Last 3 Encounters:  02/12/21 130/78  08/14/20 128/80  09/21/19 (!) 146/88   Wt Readings from Last 3 Encounters:  02/12/21 174 lb (78.9 kg)  08/14/20 178 lb 9.6 oz (81 kg)  09/21/19 174 lb (78.9 kg)   Body mass index is 31.83 kg/m.   Physical Exam    Constitutional:      General: She is not in acute distress.    Appearance: Normal appearance. She is not ill-appearing.  HENT:     Head: Normocephalic and atraumatic.  Abdominal:     General: There is no distension.     Palpations: Abdomen is soft.     Tenderness: There is no abdominal tenderness. There is no right CVA tenderness, left CVA tenderness, guarding or rebound.  Skin:    General: Skin is warm and dry.  Neurological:     Mental Status: She is alert.       Assessment & Plan:    See Problem List for Assessment and Plan of chronic medical problems.    This visit occurred during the SARS-CoV-2 public health emergency.  Safety protocols were  in place, including screening questions prior to the visit, additional usage of staff PPE, and extensive cleaning of exam room while observing appropriate contact time as indicated for disinfecting solutions.

## 2021-02-12 ENCOUNTER — Ambulatory Visit (INDEPENDENT_AMBULATORY_CARE_PROVIDER_SITE_OTHER): Payer: Medicare HMO | Admitting: Internal Medicine

## 2021-02-12 ENCOUNTER — Encounter: Payer: Self-pay | Admitting: Internal Medicine

## 2021-02-12 ENCOUNTER — Other Ambulatory Visit: Payer: Self-pay

## 2021-02-12 VITALS — BP 130/78 | HR 87 | Temp 98.2°F | Ht 62.0 in | Wt 174.0 lb

## 2021-02-12 DIAGNOSIS — N3000 Acute cystitis without hematuria: Secondary | ICD-10-CM | POA: Insufficient documentation

## 2021-02-12 DIAGNOSIS — R300 Dysuria: Secondary | ICD-10-CM

## 2021-02-12 DIAGNOSIS — N3001 Acute cystitis with hematuria: Secondary | ICD-10-CM

## 2021-02-12 LAB — POC URINALSYSI DIPSTICK (AUTOMATED)
Bilirubin, UA: NEGATIVE
Glucose, UA: NEGATIVE
Ketones, UA: NEGATIVE
Nitrite, UA: NEGATIVE
Protein, UA: NEGATIVE
Spec Grav, UA: 1.01 (ref 1.010–1.025)
Urobilinogen, UA: 0.2 E.U./dL
pH, UA: 6 (ref 5.0–8.0)

## 2021-02-12 MED ORDER — CEPHALEXIN 500 MG PO CAPS: 500.0000 mg | ORAL_CAPSULE | Freq: Two times a day (BID) | ORAL | 0 refills | Status: DC

## 2021-02-12 NOTE — Patient Instructions (Signed)
Take the antibiotic as prescribed.  Take tylenol if needed.     Increase your water intake.   Call if no improvement     Urinary Tract Infection, Adult A urinary tract infection (UTI) is an infection of any part of the urinary tract, which includes the kidneys, ureters, bladder, and urethra. These organs make, store, and get rid of urine in the body. UTI can be a bladder infection (cystitis) or kidney infection (pyelonephritis). What are the causes? This infection may be caused by fungi, viruses, or bacteria. Bacteria are the most common cause of UTIs. This condition can also be caused by repeated incomplete emptying of the bladder during urination. What increases the risk? This condition is more likely to develop if:  You ignore your need to urinate or hold urine for long periods of time.  You do not empty your bladder completely during urination.  You wipe back to front after urinating or having a bowel movement, if you are female.  You are uncircumcised, if you are female.  You are constipated.  You have a urinary catheter that stays in place (indwelling).  You have a weak defense (immune) system.  You have a medical condition that affects your bowels, kidneys, or bladder.  You have diabetes.  You take antibiotic medicines frequently or for long periods of time, and the antibiotics no longer work well against certain types of infections (antibiotic resistance).  You take medicines that irritate your urinary tract.  You are exposed to chemicals that irritate your urinary tract.  You are female.  What are the signs or symptoms? Symptoms of this condition include:  Fever.  Frequent urination or passing small amounts of urine frequently.  Needing to urinate urgently.  Pain or burning with urination.  Urine that smells bad or unusual.  Cloudy urine.  Pain in the lower abdomen or back.  Trouble urinating.  Blood in the urine.  Vomiting or being less hungry than  normal.  Diarrhea or abdominal pain.  Vaginal discharge, if you are female.  How is this diagnosed? This condition is diagnosed with a medical history and physical exam. You will also need to provide a urine sample to test your urine. Other tests may be done, including:  Blood tests.  Sexually transmitted disease (STD) testing.  If you have had more than one UTI, a cystoscopy or imaging studies may be done to determine the cause of the infections. How is this treated? Treatment for this condition often includes a combination of two or more of the following:  Antibiotic medicine.  Other medicines to treat less common causes of UTI.  Over-the-counter medicines to treat pain.  Drinking enough water to stay hydrated.  Follow these instructions at home:  Take over-the-counter and prescription medicines only as told by your health care provider.  If you were prescribed an antibiotic, take it as told by your health care provider. Do not stop taking the antibiotic even if you start to feel better.  Avoid alcohol, caffeine, tea, and carbonated beverages. They can irritate your bladder.  Drink enough fluid to keep your urine clear or pale yellow.  Keep all follow-up visits as told by your health care provider. This is important.  Make sure to: ? Empty your bladder often and completely. Do not hold urine for long periods of time. ? Empty your bladder before and after sex. ? Wipe from front to back after a bowel movement if you are female. Use each tissue one time when you   wipe. Contact a health care provider if:  You have back pain.  You have a fever.  You feel nauseous or vomit.  Your symptoms do not get better after 3 days.  Your symptoms go away and then return. Get help right away if:  You have severe back pain or lower abdominal pain.  You are vomiting and cannot keep down any medicines or water. This information is not intended to replace advice given to you by  your health care provider. Make sure you discuss any questions you have with your health care provider. Document Released: 03/25/2005 Document Revised: 11/27/2015 Document Reviewed: 05/06/2015 Elsevier Interactive Patient Education  2018 [employer]   

## 2021-02-12 NOTE — Addendum Note (Signed)
Addended by: Madelon Lips on: 02/12/2021 08:34 AM   Modules accepted: Orders

## 2021-02-12 NOTE — Assessment & Plan Note (Signed)
Acute Urine dip consistent with UTI Will send urine for culture Take the antibiotic as prescribed.  Keflex 500 mg bid x 7 days Take tylenol if needed.   Increase your water intake.  Call if no improvement   

## 2021-02-13 DIAGNOSIS — H40013 Open angle with borderline findings, low risk, bilateral: Secondary | ICD-10-CM | POA: Diagnosis not present

## 2021-02-16 LAB — CULTURE, URINE COMPREHENSIVE

## 2021-02-17 ENCOUNTER — Other Ambulatory Visit: Payer: Self-pay

## 2021-02-17 ENCOUNTER — Ambulatory Visit (INDEPENDENT_AMBULATORY_CARE_PROVIDER_SITE_OTHER): Payer: Medicare HMO | Admitting: Internal Medicine

## 2021-02-17 ENCOUNTER — Encounter: Payer: Self-pay | Admitting: Internal Medicine

## 2021-02-17 VITALS — BP 132/80 | HR 85 | Temp 98.0°F | Ht 62.0 in | Wt 172.6 lb

## 2021-02-17 DIAGNOSIS — I10 Essential (primary) hypertension: Secondary | ICD-10-CM

## 2021-02-17 DIAGNOSIS — Z0000 Encounter for general adult medical examination without abnormal findings: Secondary | ICD-10-CM

## 2021-02-17 DIAGNOSIS — Z1211 Encounter for screening for malignant neoplasm of colon: Secondary | ICD-10-CM | POA: Diagnosis not present

## 2021-02-17 MED ORDER — TRIAMTERENE-HCTZ 37.5-25 MG PO TABS: 1.0000 | ORAL_TABLET | Freq: Every day | ORAL | 3 refills | Status: DC

## 2021-02-17 MED ORDER — SHINGRIX 50 MCG/0.5ML IM SUSR: 0.5000 mL | Freq: Once | INTRAMUSCULAR | 1 refills | Status: AC

## 2021-02-17 NOTE — Patient Instructions (Signed)

## 2021-02-17 NOTE — Assessment & Plan Note (Signed)
  We discussed age appropriate health related issues, including available/recomended screening tests and vaccinations. Labs were ordered to be later reviewed . All questions were answered. We discussed one or more of the following - seat belt use, use of sunscreen/sun exposure exercise, fall risk reduction, second hand smoke exposure, firearm use and storage, seat belt use, a need for adhering to healthy diet and exercise. Labs were ordered.  All questions were answered. Pt declined colonoscopy Cologuard Pt had an eye exam, mammogramm Cor calcium CT was offered

## 2021-02-17 NOTE — Progress Notes (Signed)
Subjective:  Patient ID: Theresa Terry, female    DOB: [birth year]  Age: 70 y.o. MRN: [id number]  CC: Annual Exam   HPI Theresa Terry presents for well exam    Outpatient Medications Prior to Visit  Medication Sig Dispense Refill   albuterol (PROVENTIL HFA;VENTOLIN HFA) 108 (90 Base) MCG/ACT inhaler Inhale 1-2 puffs into the lungs every 6 (six) hours as needed for wheezing. 1 Inhaler 3   albuterol (PROVENTIL) (2.5 MG/3ML) 0.083% nebulizer solution Take 3 mLs (2.5 mg total) by nebulization 3 (three) times daily as needed for wheezing or shortness of breath. 50 vial 6   budesonide-formoterol (SYMBICORT) 160-4.5 MCG/ACT inhaler Inhale 2 puffs into the lungs 2 (two) times daily. 1 Inhaler 12   cephALEXin (KEFLEX) 500 MG capsule Take 1 capsule (500 mg total) by mouth 2 (two) times daily. 14 capsule 0   Cholecalciferol (VITAMIN D3) 2000 units capsule Take 1 capsule (2,000 Units total) by mouth daily. 100 capsule 3   esomeprazole (NEXIUM) 20 MG capsule Take 1 capsule (20 mg total) by mouth daily at 12 noon. 90 capsule 3   fluticasone (FLONASE) 50 MCG/ACT nasal spray Place 2 sprays into both nostrils daily. 48 g 3   loratadine (CLARITIN) 10 MG tablet Take 10 mg by mouth daily.     Multiple Vitamin (MULTIVITAMIN) tablet Take 1 tablet by mouth daily.     triamcinolone cream (KENALOG) 0.5 % Apply 1 application topically 4 (four) times daily. 45 g 1   triamterene-hydrochlorothiazide (MAXZIDE-25) 37.5-25 MG tablet Take 1 tablet by mouth daily. Annual appt is due in must see provider for future refills 30 tablet 0   No facility-administered medications prior to visit.    ROS: Review of Systems  Constitutional:  Negative for activity change, appetite change, chills, fatigue and unexpected weight change.  HENT:  Negative for congestion, mouth sores and sinus pressure.   Eyes:  Negative for visual disturbance.  Respiratory:  Negative for cough and chest tightness.   Gastrointestinal:   Negative for abdominal pain and nausea.  Genitourinary:  Negative for difficulty urinating, frequency and vaginal pain.  Musculoskeletal:  Negative for back pain and gait problem.  Skin:  Negative for pallor and rash.  Neurological:  Negative for dizziness, tremors, weakness, numbness and headaches.  Psychiatric/Behavioral:  Negative for confusion, dysphoric mood and sleep disturbance. The patient is nervous/anxious.    Objective:  BP 132/80 (BP Location: Left Arm)   Pulse 85   Temp 98 F (36.7 C) (Oral)   Ht 5\' 2"  (1.575 m)   Wt 172 lb 9.6 oz (78.3 kg)   SpO2 96%   BMI 31.57 kg/m   BP Readings from Last 3 Encounters:  02/17/21 132/80  02/12/21 130/78  08/14/20 128/80    Wt Readings from Last 3 Encounters:  02/17/21 172 lb 9.6 oz (78.3 kg)  02/12/21 174 lb (78.9 kg)  08/14/20 178 lb 9.6 oz (81 kg)    Physical Exam Constitutional:      General: She is not in acute distress.    Appearance: She is well-developed.  HENT:     Head: Normocephalic.     Right Ear: External ear normal.     Left Ear: External ear normal.     Nose: Nose normal.  Eyes:     General:        Right eye: No discharge.        Left eye: No discharge.     Conjunctiva/sclera: Conjunctivae normal.  Pupils: Pupils are equal, round, and reactive to light.  Neck:     Thyroid: No thyromegaly.     Vascular: No JVD.     Trachea: No tracheal deviation.  Cardiovascular:     Rate and Rhythm: Normal rate and regular rhythm.     Heart sounds: Normal heart sounds.  Pulmonary:     Effort: No respiratory distress.     Breath sounds: No stridor. No wheezing.  Abdominal:     General: Bowel sounds are normal. There is no distension.     Palpations: Abdomen is soft. There is no mass.     Tenderness: There is no abdominal tenderness. There is no guarding or rebound.  Musculoskeletal:        General: No tenderness.     Cervical back: Normal range of motion and neck supple. No rigidity.  Lymphadenopathy:      Cervical: No cervical adenopathy.  Skin:    Findings: No erythema or rash.  Neurological:     Cranial Nerves: No cranial nerve deficit.     Motor: No abnormal muscle tone.     Coordination: Coordination normal.     Deep Tendon Reflexes: Reflexes normal.  Psychiatric:        Behavior: Behavior normal.        Thought Content: Thought content normal.        Judgment: Judgment normal.    Lab Results  Component Value Date   WBC 6.6 09/21/2019   HGB 14.9 09/21/2019   HCT 43.3 09/21/2019   PLT 295.0 09/21/2019   GLUCOSE 103 (H) 09/21/2019   CHOL 201 (H) 09/21/2019   TRIG 109.0 09/21/2019   HDL 73.40 09/21/2019   LDLCALC 106 (H) 09/21/2019   ALT 17 09/21/2019   AST 17 09/21/2019   NA 140 09/21/2019   K 4.2 09/21/2019   CL 103 09/21/2019   CREATININE 0.84 09/21/2019   BUN 13 09/21/2019   CO2 31 09/21/2019   TSH 1.09 09/21/2019   HGBA1C 5.8 12/02/2015    MM 3D SCREEN BREAST BILATERAL  Result Date: 02/08/2021 CLINICAL DATA:  Screening. EXAM: DIGITAL SCREENING BILATERAL MAMMOGRAM WITH TOMOSYNTHESIS AND CAD TECHNIQUE: Bilateral screening digital craniocaudal and mediolateral oblique mammograms were obtained. Bilateral screening digital breast tomosynthesis was performed. The images were evaluated with computer-aided detection. COMPARISON:  Previous exam(s). ACR Breast Density Category a: The breast tissue is almost entirely fatty. FINDINGS: There are no findings suspicious for malignancy. IMPRESSION: No mammographic evidence of malignancy. A result letter of this screening mammogram will be mailed directly to the patient. RECOMMENDATION: Screening mammogram in one year. (Code:SM-B-01Y) BI-RADS CATEGORY  1: Negative. Electronically Signed   By: Frederico Hamman M.D.   On: 02/08/2021 10:06    Assessment & Plan:     Follow-up: No follow-ups on file.  Sonda Primes, MD

## 2021-02-17 NOTE — Assessment & Plan Note (Signed)
Coronary calcium CT offered  Cont w/Maxzide

## 2021-03-12 ENCOUNTER — Other Ambulatory Visit: Payer: Self-pay

## 2021-03-12 ENCOUNTER — Other Ambulatory Visit (INDEPENDENT_AMBULATORY_CARE_PROVIDER_SITE_OTHER): Payer: Medicare HMO

## 2021-03-12 DIAGNOSIS — Z0000 Encounter for general adult medical examination without abnormal findings: Secondary | ICD-10-CM | POA: Diagnosis not present

## 2021-03-12 LAB — CBC WITH DIFFERENTIAL/PLATELET
Basophils Absolute: 0.1 10*3/uL (ref 0.0–0.1)
Basophils Relative: 0.8 % (ref 0.0–3.0)
Eosinophils Absolute: 0.1 10*3/uL (ref 0.0–0.7)
Eosinophils Relative: 1.1 % (ref 0.0–5.0)
HCT: 44 % (ref 36.0–46.0)
Hemoglobin: 14.5 g/dL (ref 12.0–15.0)
Lymphocytes Relative: 26.3 % (ref 12.0–46.0)
Lymphs Abs: 1.9 10*3/uL (ref 0.7–4.0)
MCHC: 33 g/dL (ref 30.0–36.0)
MCV: 92.8 fl (ref 78.0–100.0)
Monocytes Absolute: 0.5 10*3/uL (ref 0.1–1.0)
Monocytes Relative: 7.5 % (ref 3.0–12.0)
Neutro Abs: 4.6 10*3/uL (ref 1.4–7.7)
Neutrophils Relative %: 64.3 % (ref 43.0–77.0)
Platelets: 324 10*3/uL (ref 150.0–400.0)
RBC: 4.74 Mil/uL (ref 3.87–5.11)
RDW: 13.4 % (ref 11.5–15.5)
WBC: 7.1 10*3/uL (ref 4.0–10.5)

## 2021-03-12 LAB — COMPREHENSIVE METABOLIC PANEL
ALT: 16 U/L (ref 0–35)
AST: 19 U/L (ref 0–37)
Albumin: 4.1 g/dL (ref 3.5–5.2)
Alkaline Phosphatase: 71 U/L (ref 39–117)
BUN: 15 mg/dL (ref 6–23)
CO2: 29 mEq/L (ref 19–32)
Calcium: 10.3 mg/dL (ref 8.4–10.5)
Chloride: 101 mEq/L (ref 96–112)
Creatinine, Ser: 0.91 mg/dL (ref 0.40–1.20)
GFR: 63.14 mL/min (ref >60.00)
Glucose, Bld: 123 mg/dL — ABNORMAL HIGH (ref 70–99)
Potassium: 4 mEq/L (ref 3.5–5.1)
Sodium: 138 mEq/L (ref 135–145)
Total Bilirubin: 0.5 mg/dL (ref 0.2–1.2)
Total Protein: 7.1 g/dL (ref 6.0–8.3)

## 2021-03-12 LAB — URINALYSIS
Bilirubin Urine: NEGATIVE
Hgb urine dipstick: NEGATIVE
Ketones, ur: NEGATIVE
Leukocytes,Ua: NEGATIVE
Nitrite: NEGATIVE
Specific Gravity, Urine: <=1.005 — AB (ref 1.000–1.030)
Total Protein, Urine: NEGATIVE
Urine Glucose: NEGATIVE
Urobilinogen, UA: 0.2 (ref 0.0–1.0)
pH: 6.5 (ref 5.0–8.0)

## 2021-03-12 LAB — LIPID PANEL
Cholesterol: 177 mg/dL (ref 0–200)
HDL: 71 mg/dL (ref >39.00)
LDL Cholesterol: 88 mg/dL (ref 0–99)
NonHDL: 106.15
Total CHOL/HDL Ratio: 2
Triglycerides: 91 mg/dL (ref 0.0–149.0)
VLDL: 18.2 mg/dL (ref 0.0–40.0)

## 2021-03-12 LAB — TSH: TSH: 1.24 u[IU]/mL (ref 0.35–5.50)

## 2021-04-01 ENCOUNTER — Telehealth: Payer: Self-pay | Admitting: Internal Medicine

## 2021-04-01 MED ORDER — AZITHROMYCIN 250 MG PO TABS: ORAL_TABLET | ORAL | 0 refills | Status: DC

## 2021-04-01 NOTE — Telephone Encounter (Signed)
Sinusitis symptoms, ear pain.  We will send an antibiotic in.

## 2021-07-30 ENCOUNTER — Telehealth: Payer: Self-pay

## 2021-07-30 DIAGNOSIS — Z1211 Encounter for screening for malignant neoplasm of colon: Secondary | ICD-10-CM

## 2021-07-30 NOTE — Telephone Encounter (Signed)
Pt is calling requesting a Cologuard test kit to be mailed to her.  Mail address: [address] [place] [address]  Please advise pt [phone]

## 2021-07-30 NOTE — Telephone Encounter (Signed)
I ordered Cologuard again. This test was ordered back in August. Thanks

## 2021-07-30 NOTE — Telephone Encounter (Signed)
Tried to order cologuard in [employer] Portal will not let me order.Marland KitchenRaechel Chute

## 2021-08-01 NOTE — Telephone Encounter (Signed)
Notified pt with MD response../[email or web address] 

## 2021-08-15 ENCOUNTER — Ambulatory Visit: Payer: Medicare HMO

## 2021-08-27 ENCOUNTER — Ambulatory Visit (INDEPENDENT_AMBULATORY_CARE_PROVIDER_SITE_OTHER): Payer: Medicare HMO

## 2021-08-27 DIAGNOSIS — Z1211 Encounter for screening for malignant neoplasm of colon: Secondary | ICD-10-CM | POA: Diagnosis not present

## 2021-08-27 DIAGNOSIS — M810 Age-related osteoporosis without current pathological fracture: Secondary | ICD-10-CM

## 2021-08-27 DIAGNOSIS — Z0000 Encounter for general adult medical examination without abnormal findings: Secondary | ICD-10-CM

## 2021-08-27 NOTE — Progress Notes (Addendum)
I connected with Theresa Terry today by telephone and verified that I am speaking with the correct person using two identifiers. Location patient: home Location provider: work Persons participating in the virtual visit: patient, provider.   I discussed the limitations, risks, security and privacy concerns of performing an evaluation and management service by telephone and the availability of in person appointments. I also discussed with the patient that there may be a patient responsible charge related to this service. The patient expressed understanding and verbally consented to this telephonic visit.    Interactive audio and video telecommunications were attempted between this provider and patient, however failed, due to patient having technical difficulties OR patient did not have access to video capability.  We continued and completed visit with audio only.  Some vital signs may be absent or patient reported.   Time Spent with patient on telephone encounter: 40 minutes  Subjective:   Theresa Terry is a 71 y.o. female who presents for Medicare Annual (Subsequent) preventive examination.  Review of Systems     Cardiac Risk Factors include: advanced age (>51men, >70 women);hypertension;family history of premature cardiovascular disease     Objective:    There were no vitals filed for this visit. There is no height or weight on file to calculate BMI.  Advanced Directives 08/27/2021 08/14/2020 10/11/2017  Does Patient Have a Medical Advance Directive? No No No  Would patient like information on creating a medical advance directive? No - Patient declined Yes (MAU/Ambulatory/Procedural Areas - Information given) Yes (ED - Information included in AVS)    Current Medications (verified) Outpatient Encounter Medications as of 08/27/2021  Medication Sig   albuterol (PROVENTIL HFA;VENTOLIN HFA) 108 (90 Base) MCG/ACT inhaler Inhale 1-2 puffs into the lungs every 6 (six) hours as needed for  wheezing.   albuterol (PROVENTIL) (2.5 MG/3ML) 0.083% nebulizer solution Take 3 mLs (2.5 mg total) by nebulization 3 (three) times daily as needed for wheezing or shortness of breath.   azithromycin (ZITHROMAX Z-PAK) 250 MG tablet As directed   budesonide-formoterol (SYMBICORT) 160-4.5 MCG/ACT inhaler Inhale 2 puffs into the lungs 2 (two) times daily.   Cholecalciferol (VITAMIN D3) 2000 units capsule Take 1 capsule (2,000 Units total) by mouth daily.   esomeprazole (NEXIUM) 20 MG capsule Take 1 capsule (20 mg total) by mouth daily at 12 noon.   fluticasone (FLONASE) 50 MCG/ACT nasal spray Place 2 sprays into both nostrils daily.   loratadine (CLARITIN) 10 MG tablet Take 10 mg by mouth daily.   Multiple Vitamin (MULTIVITAMIN) tablet Take 1 tablet by mouth daily.   triamterene-hydrochlorothiazide (MAXZIDE-25) 37.5-25 MG tablet Take 1 tablet by mouth daily. Annual appt is due in must see provider for future refills   No facility-administered encounter medications on file as of 08/27/2021.    Allergies (verified) Patient has no known allergies.   History: Past Medical History:  Diagnosis Date   Allergy    Arthritis    Asthma    Cataract    GERD (gastroesophageal reflux disease)    Hypertension    Past Surgical History:  Procedure Laterality Date   ABDOMINAL HYSTERECTOMY     complete   TONSILLECTOMY AND ADENOIDECTOMY  1961   Family History  Problem Relation Age of Onset   Hypertension Mother    Heart failure Mother    Alcohol abuse Mother    Heart disease Mother    Alcohol abuse Father    Heart disease Father    Alcohol abuse Brother    Heart  disease Maternal Aunt    Heart disease Maternal Uncle    Social History   Socioeconomic History   Marital status: Divorced    Spouse name: Not on file   Number of children: 4   Years of education: Not on file   Highest education level: Not on file  Occupational History   Not on file  Tobacco Use   Smoking status: Never     Passive exposure: Yes   Smokeless tobacco: Never  Vaping Use   Vaping Use: Never used  Substance and Sexual Activity   Alcohol use: Yes    Alcohol/week: 0.0 standard drinks   Drug use: No   Sexual activity: Not Currently  Other Topics Concern   Not on file  Social History Narrative   Not on file   Social Determinants of Health   Financial Resource Strain: Low Risk    Difficulty of Paying Living Expenses: Not hard at all  Food Insecurity: No Food Insecurity   Worried About Charity fundraiser in the Last Year: Never true   [place] in the Last Year: Never true  Transportation Needs: No Transportation Needs   Lack of Transportation (Medical): No   Lack of Transportation (Non-Medical): No  Physical Activity: Sufficiently Active   Days of Exercise per Week: 5 days   Minutes of Exercise per Session: 30 min  Stress: No Stress Concern Present   Feeling of Stress : Not at all  Social Connections: Moderately Integrated   Frequency of Communication with Friends and Family: More than three times a week   Frequency of Social Gatherings with Friends and Family: Once a week   Attends Religious Services: 1 to 4 times per year   Active Member of [employer] or Organizations: Yes   Attends Archivist Meetings: 1 to 4 times per year   Marital Status: Divorced    Tobacco Counseling Counseling given: Not Answered   Clinical Intake:  Pre-visit preparation completed: Yes  Pain : No/denies pain     Nutritional Risks: None Diabetes: No  How often do you need to have someone help you when you read instructions, pamphlets, or other written materials from your doctor or pharmacy?: 1 - Never What is the last grade level you completed in school?: HSG  Diabetic? no  Interpreter Needed?: No  Information entered by :: Lisette Abu, LPN   Activities of Daily Living In your present state of health, do you have any difficulty performing the following activities: 08/27/2021   Hearing? N  Vision? N  Difficulty concentrating or making decisions? N  Walking or climbing stairs? N  Dressing or bathing? N  Doing errands, shopping? N  Preparing Food and eating ? N  Using the Toilet? N  In the past six months, have you accidently leaked urine? N  Do you have problems with loss of bowel control? N  Managing your Medications? N  Managing your Finances? N  Housekeeping or managing your Housekeeping? N  Some recent data might be hidden    Patient Care Team: Plotnikov, Evie Lacks, MD as PCP - General (Internal Medicine) [facility] as Consulting Physician (Optometry)  Indicate any recent Medical Services you may have received from other than [facility] providers in the past year (date may be approximate).     Assessment:   This is a routine wellness examination for [place].  Hearing/Vision screen Hearing Screening - Comments:: Patient denied any hearing difficulty.   No hearing aids.  Vision Screening - Comments:: Patient does not wear any corrective lenses/contacts.   Eye exam done by: Dr. Seth Bake  Dietary issues and exercise activities discussed: Current Exercise Habits: Home exercise routine, Type of exercise: walking, Time (Minutes): 30, Frequency (Times/Week): 5, Weekly Exercise (Minutes/Week): 150, Intensity: Moderate   Goals Addressed               This Visit's Progress     Patient Stated (pt-stated)        My goal is to lose 10 pounds.      Depression Screen PHQ 2/9 Scores 08/27/2021 02/17/2021 08/14/2020 09/21/2019 10/11/2017 02/24/2015 12/24/2014  PHQ - 2 Score 0 0 0 0 0 0 0  PHQ- 9 Score - 2 - - 0 - -    Fall Risk Fall Risk  08/27/2021 02/17/2021 08/14/2020 09/21/2019 10/11/2017  Falls in the past year? 0 0 0 0 No  Number falls in past yr: 0 0 0 - -  Injury with Fall? 0 0 0 - -  Risk for fall due to : No Fall Risks No Fall Risks No Fall Risks - -  Follow up Falls evaluation completed - Falls evaluation completed Falls evaluation  completed -    FALL RISK PREVENTION PERTAINING TO THE HOME:  Any stairs in or around the home? No  If so, are there any without handrails? No  Home free of loose throw rugs in walkways, pet beds, electrical cords, etc? Yes  Adequate lighting in your home to reduce risk of falls? Yes   ASSISTIVE DEVICES UTILIZED TO PREVENT FALLS:  Life alert? No  Use of a cane, walker or w/c? No  Grab bars in the bathroom? Yes  Shower chair or bench in shower? Yes  Elevated toilet seat or a handicapped toilet? Yes   TIMED UP AND GO:  Was the test performed? No .  Length of time to ambulate 10 feet: n/a sec.   Gait steady and fast without use of assistive device  Cognitive Function: Normal cognitive status assessed by direct observation by this Nurse Health Advisor. No abnormalities found.          Immunizations Immunization History  Administered Date(s) Administered   PFIZER(Purple Top)SARS-COV-2 Vaccination 09/16/2019, 10/07/2019   Tdap 03/11/2018    TDAP status: Up to date  Flu Vaccine status: Declined, Education has been provided regarding the importance of this vaccine but patient still declined. Advised may receive this vaccine at local pharmacy or Health Dept. Aware to provide a copy of the vaccination record if obtained from local pharmacy or Health Dept. Verbalized acceptance and understanding.  Pneumococcal vaccine status: Declined,  Education has been provided regarding the importance of this vaccine but patient still declined. Advised may receive this vaccine at local pharmacy or Health Dept. Aware to provide a copy of the vaccination record if obtained from local pharmacy or Health Dept. Verbalized acceptance and understanding.   Covid-19 vaccine status: Completed vaccines  Qualifies for Shingles Vaccine? Yes   Zostavax completed No   Shingrix Completed?: No.    Education has been provided regarding the importance of this vaccine. Patient has been advised to call insurance  company to determine out of pocket expense if they have not yet received this vaccine. Advised may also receive vaccine at local pharmacy or Health Dept. Verbalized acceptance and understanding.  Screening Tests Health Maintenance  Topic Date Due   COLONOSCOPY (Pts 45-87yrs Insurance coverage will need to be confirmed)  Never done   Zoster Vaccines-  Shingrix (1 of 2) Never done   Pneumonia Vaccine 74+ Years old (1 - PCV) Never done   COVID-19 Vaccine (3 - Booster for Pfizer series) 12/02/2019   INFLUENZA VACCINE  Never done   MAMMOGRAM  02/07/2023   TETANUS/TDAP  03/11/2028   DEXA SCAN  Completed   Hepatitis C Screening  Completed   HPV VACCINES  Aged Out    Health Maintenance  Health Maintenance Due  Topic Date Due   COLONOSCOPY (Pts 45-27yrs Insurance coverage will need to be confirmed)  Never done   Zoster Vaccines- Shingrix (1 of 2) Never done   Pneumonia Vaccine 23+ Years old (1 - PCV) Never done   COVID-19 Vaccine (3 - Booster for [place] series) 12/02/2019   INFLUENZA VACCINE  Never done    Colorectal cancer screening: Referral to GI placed 08/27/2021. Pt aware the office will call re: appt. (Cologuard referral placed)  Mammogram status: Completed 02/06/2021. Repeat every year  Bone Density status: Ordered 08/27/2021. Pt provided with contact info and advised to call to schedule appt.  Lung Cancer Screening: (Low Dose CT Chest recommended if Age 68-80 years, 30 pack-year currently smoking OR have quit w/in 15years.) does not qualify.   Lung Cancer Screening Referral: no  Additional Screening:  Hepatitis C Screening: does qualify; Completed yes  Vision Screening: Recommended annual ophthalmology exams for early detection of glaucoma and other disorders of the eye. Is the patient up to date with their annual eye exam?  Yes  Who is the provider or what is the name of the office in which the patient attends annual eye exams? Dr. Darcey Nora If pt is not established with a  provider, would they like to be referred to a provider to establish care? No .   Dental Screening: Recommended annual dental exams for proper oral hygiene  Community Resource Referral / Chronic Care Management: CRR required this visit?  No   CCM required this visit?  No      Plan:     I have personally reviewed and noted the following in the patients chart:   Medical and social history Use of alcohol, tobacco or illicit drugs  Current medications and supplements including opioid prescriptions.  Functional ability and status Nutritional status Physical activity Advanced directives List of other physicians Hospitalizations, surgeries, and ER visits in previous 12 months Vitals Screenings to include cognitive, depression, and falls Referrals and appointments  In addition, I have reviewed and discussed with patient certain preventive protocols, quality metrics, and best practice recommendations. A written personalized care plan for preventive services as well as general preventive health recommendations were provided to patient.     Sheral Flow, LPN   [id number]   Nurse Notes:  Patient is cogitatively intact. There were no vitals filed for this visit. There is no height or weight on file to calculate BMI. Patient stated that she has no issues with gait or balance; does not use any assistive devices. Medications reviewed with patient; no opioid use noted.   Medical screening examination/treatment/procedure(s) were performed by non-physician practitioner and as supervising physician I was immediately available for consultation/collaboration.  I agree with above. Lew Dawes, MD

## 2021-08-27 NOTE — Patient Instructions (Signed)
Theresa Terry , Thank you for taking time to come for your Medicare Wellness Visit. I appreciate your ongoing commitment to your health goals. Please review the following plan we discussed and let me know if I can assist you in the future.   Screening recommendations/referrals: Cologuard: Ordered 08/27/2021 Mammogram: 02/06/2021; due every 1-2 years Bone Density: 11/12/2017; due every 2-3 years; ordered 08/27/2021 at [facility] Recommended yearly ophthalmology/optometry visit for glaucoma screening and checkup Recommended yearly dental visit for hygiene and checkup  Vaccinations: Influenza vaccine: declined Pneumococcal vaccine: declined Tdap vaccine: 03/11/2018; due every 10 years Shingles vaccine: declined   Covid-19: 09/16/2019, 10/07/2019  Advanced directives: Advance directive discussed with you today. Even though you declined this today please call our office should you change your mind and we can give you the proper paperwork for you to fill out.  Conditions/risks identified: Yes; Client understands the importance of follow-up appointments with providers by attending scheduled visits and discussed goals to eat healthier, increase physical activity 5 times a week for 30 minutes each, exercise the brain by doing stimulating brain exercises (reading, adult coloring, crafting, listening to music, puzzles, etc.), socialize and enjoy life more, get enough sleep at least 8-9 hours average per night and make time for laughter.  Next appointment: Please schedule your next Medicare Wellness Visit with your Nurse Health Advisor in 1 year by calling [phone].   Preventive Care 73 Years and Older, Female Preventive care refers to lifestyle choices and visits with your health care provider that can promote health and wellness. What does preventive care include? A yearly physical exam. This is also called an annual well check. Dental exams once or twice a year. Routine eye exams. Ask your health  care provider how often you should have your eyes checked. Personal lifestyle choices, including: Daily care of your teeth and gums. Regular physical activity. Eating a healthy diet. Avoiding tobacco and drug use. Limiting alcohol use. Practicing safe sex. Taking low-dose aspirin every day. Taking vitamin and mineral supplements as recommended by your health care provider. What happens during an annual well check? The services and screenings done by your health care provider during your annual well check will depend on your age, overall health, lifestyle risk factors, and family history of disease. Counseling  Your health care provider may ask you questions about your: Alcohol use. Tobacco use. Drug use. Emotional well-being. Home and relationship well-being. Sexual activity. Eating habits. History of falls. Memory and ability to understand (cognition). Work and work Astronomer. Reproductive health. Screening  You may have the following tests or measurements: Height, weight, and BMI. Blood pressure. Lipid and cholesterol levels. These may be checked every 5 years, or more frequently if you are over 73 years old. Skin check. Lung cancer screening. You may have this screening every year starting at age 73 if you have a 30-pack-year history of smoking and currently smoke or have quit within the past 15 years. Fecal occult blood test (FOBT) of the stool. You may have this test every year starting at age 73. Flexible sigmoidoscopy or colonoscopy. You may have a sigmoidoscopy every 5 years or a colonoscopy every 10 years starting at age 73. Hepatitis C blood test. Hepatitis B blood test. Sexually transmitted disease (STD) testing. Diabetes screening. This is done by checking your blood sugar (glucose) after you have not eaten for a while (fasting). You may have this done every 1-3 years. Bone density scan. This is done to screen for osteoporosis. You may have this  done starting at  age 73. Mammogram. This may be done every 1-2 years. Talk to your health care provider about how often you should have regular mammograms. Talk with your health care provider about your test results, treatment options, and if necessary, the need for more tests. Vaccines  Your health care provider may recommend certain vaccines, such as: Influenza vaccine. This is recommended every year. Tetanus, diphtheria, and acellular pertussis (Tdap, Td) vaccine. You may need a Td booster every 10 years. Zoster vaccine. You may need this after age 73. Pneumococcal 13-valent conjugate (PCV13) vaccine. One dose is recommended after age 73. Pneumococcal polysaccharide (PPSV23) vaccine. One dose is recommended after age 73. Talk to your health care provider about which screenings and vaccines you need and how often you need them. This information is not intended to replace advice given to you by your health care provider. Make sure you discuss any questions you have with your health care provider. Document Released: 07/12/2015 Document Revised: 03/04/2016 Document Reviewed: 04/16/2015 Elsevier Interactive Patient Education  2017 [employer].  Fall Prevention in the Home Falls can cause injuries. They can happen to people of all ages. There are many things you can do to make your home safe and to help prevent falls. What can I do on the outside of my home? Regularly fix the edges of walkways and driveways and fix any cracks. Remove anything that might make you trip as you walk through a door, such as a raised step or threshold. Trim any bushes or trees on the path to your home. Use bright outdoor lighting. Clear any walking paths of anything that might make someone trip, such as rocks or tools. Regularly check to see if handrails are loose or broken. Make sure that both sides of any steps have handrails. Any raised decks and porches should have guardrails on the edges. Have any leaves, snow, or ice cleared  regularly. Use sand or salt on walking paths during winter. Clean up any spills in your garage right away. This includes oil or grease spills. What can I do in the bathroom? Use night lights. Install grab bars by the toilet and in the tub and shower. Do not use towel bars as grab bars. Use non-skid mats or decals in the tub or shower. If you need to sit down in the shower, use a plastic, non-slip stool. Keep the floor dry. Clean up any water that spills on the floor as soon as it happens. Remove soap buildup in the tub or shower regularly. Attach bath mats securely with double-sided non-slip rug tape. Do not have throw rugs and other things on the floor that can make you trip. What can I do in the bedroom? Use night lights. Make sure that you have a light by your bed that is easy to reach. Do not use any sheets or blankets that are too big for your bed. They should not hang down onto the floor. Have a firm chair that has side arms. You can use this for support while you get dressed. Do not have throw rugs and other things on the floor that can make you trip. What can I do in the kitchen? Clean up any spills right away. Avoid walking on wet floors. Keep items that you use a lot in easy-to-reach places. If you need to reach something above you, use a strong step stool that has a grab bar. Keep electrical cords out of the way. Do not use floor polish or wax  that makes floors slippery. If you must use wax, use non-skid floor wax. Do not have throw rugs and other things on the floor that can make you trip. What can I do with my stairs? Do not leave any items on the stairs. Make sure that there are handrails on both sides of the stairs and use them. Fix handrails that are broken or loose. Make sure that handrails are as long as the stairways. Check any carpeting to make sure that it is firmly attached to the stairs. Fix any carpet that is loose or worn. Avoid having throw rugs at the top or  bottom of the stairs. If you do have throw rugs, attach them to the floor with carpet tape. Make sure that you have a light switch at the top of the stairs and the bottom of the stairs. If you do not have them, ask someone to add them for you. What else can I do to help prevent falls? Wear shoes that: Do not have high heels. Have rubber bottoms. Are comfortable and fit you well. Are closed at the toe. Do not wear sandals. If you use a stepladder: Make sure that it is fully opened. Do not climb a closed stepladder. Make sure that both sides of the stepladder are locked into place. Ask someone to hold it for you, if possible. Clearly mark and make sure that you can see: Any grab bars or handrails. First and last steps. Where the edge of each step is. Use tools that help you move around (mobility aids) if they are needed. These include: Canes. Walkers. Scooters. Crutches. Turn on the lights when you go into a dark area. Replace any light bulbs as soon as they burn out. Set up your furniture so you have a clear path. Avoid moving your furniture around. If any of your floors are uneven, fix them. If there are any pets around you, be aware of where they are. Review your medicines with your doctor. Some medicines can make you feel dizzy. This can increase your chance of falling. Ask your doctor what other things that you can do to help prevent falls. This information is not intended to replace advice given to you by your health care provider. Make sure you discuss any questions you have with your health care provider. Document Released: 04/11/2009 Document Revised: 11/21/2015 Document Reviewed: 07/20/2014 Elsevier Interactive Patient Education  2017 [employer].

## 2022-04-27 ENCOUNTER — Encounter: Payer: Self-pay | Admitting: Internal Medicine

## 2022-04-27 ENCOUNTER — Ambulatory Visit (INDEPENDENT_AMBULATORY_CARE_PROVIDER_SITE_OTHER): Payer: Medicare HMO | Admitting: Internal Medicine

## 2022-04-27 VITALS — BP 120/88 | HR 80 | Temp 98.6°F | Ht 62.0 in | Wt 173.8 lb

## 2022-04-27 DIAGNOSIS — I10 Essential (primary) hypertension: Secondary | ICD-10-CM

## 2022-04-27 DIAGNOSIS — Z1211 Encounter for screening for malignant neoplasm of colon: Secondary | ICD-10-CM | POA: Diagnosis not present

## 2022-04-27 DIAGNOSIS — E213 Hyperparathyroidism, unspecified: Secondary | ICD-10-CM | POA: Diagnosis not present

## 2022-04-27 DIAGNOSIS — L659 Nonscarring hair loss, unspecified: Secondary | ICD-10-CM

## 2022-04-27 DIAGNOSIS — R739 Hyperglycemia, unspecified: Secondary | ICD-10-CM

## 2022-04-27 DIAGNOSIS — Z0000 Encounter for general adult medical examination without abnormal findings: Secondary | ICD-10-CM | POA: Diagnosis not present

## 2022-04-27 LAB — URINALYSIS
Bilirubin Urine: NEGATIVE
Hgb urine dipstick: NEGATIVE
Ketones, ur: NEGATIVE
Leukocytes,Ua: NEGATIVE
Nitrite: NEGATIVE
Specific Gravity, Urine: 1.01 (ref 1.000–1.030)
Total Protein, Urine: NEGATIVE
Urine Glucose: NEGATIVE
Urobilinogen, UA: 0.2 (ref 0.0–1.0)
pH: 8 (ref 5.0–8.0)

## 2022-04-27 LAB — LIPID PANEL
Cholesterol: 181 mg/dL (ref 0–200)
HDL: 76.2 mg/dL (ref >39.00)
LDL Cholesterol: 78 mg/dL (ref 0–99)
NonHDL: 105.09
Total CHOL/HDL Ratio: 2
Triglycerides: 135 mg/dL (ref 0.0–149.0)
VLDL: 27 mg/dL (ref 0.0–40.0)

## 2022-04-27 LAB — CBC WITH DIFFERENTIAL/PLATELET
Basophils Absolute: 0.1 10*3/uL (ref 0.0–0.1)
Basophils Relative: 1.3 % (ref 0.0–3.0)
Eosinophils Absolute: 0.1 10*3/uL (ref 0.0–0.7)
Eosinophils Relative: 1.2 % (ref 0.0–5.0)
HCT: 44.7 % (ref 36.0–46.0)
Hemoglobin: 14.8 g/dL (ref 12.0–15.0)
Lymphocytes Relative: 27.1 % (ref 12.0–46.0)
Lymphs Abs: 1.9 10*3/uL (ref 0.7–4.0)
MCHC: 33 g/dL (ref 30.0–36.0)
MCV: 93.1 fl (ref 78.0–100.0)
Monocytes Absolute: 0.6 10*3/uL (ref 0.1–1.0)
Monocytes Relative: 8.3 % (ref 3.0–12.0)
Neutro Abs: 4.3 10*3/uL (ref 1.4–7.7)
Neutrophils Relative %: 62.1 % (ref 43.0–77.0)
Platelets: 278 10*3/uL (ref 150.0–400.0)
RBC: 4.8 Mil/uL (ref 3.87–5.11)
RDW: 13.9 % (ref 11.5–15.5)
WBC: 6.9 10*3/uL (ref 4.0–10.5)

## 2022-04-27 LAB — COMPREHENSIVE METABOLIC PANEL
ALT: 14 U/L (ref 0–35)
AST: 16 U/L (ref 0–37)
Albumin: 4.2 g/dL (ref 3.5–5.2)
Alkaline Phosphatase: 78 U/L (ref 39–117)
BUN: 14 mg/dL (ref 6–23)
CO2: 27 mEq/L (ref 19–32)
Calcium: 10.2 mg/dL (ref 8.4–10.5)
Chloride: 106 mEq/L (ref 96–112)
Creatinine, Ser: 0.76 mg/dL (ref 0.40–1.20)
GFR: 77.76 mL/min (ref >60.00)
Glucose, Bld: 110 mg/dL — ABNORMAL HIGH (ref 70–99)
Potassium: 4.1 mEq/L (ref 3.5–5.1)
Sodium: 140 mEq/L (ref 135–145)
Total Bilirubin: 0.3 mg/dL (ref 0.2–1.2)
Total Protein: 6.9 g/dL (ref 6.0–8.3)

## 2022-04-27 LAB — TSH: TSH: 0.91 u[IU]/mL (ref 0.35–5.50)

## 2022-04-27 LAB — HEMOGLOBIN A1C: Hgb A1c MFr Bld: 6 % (ref 4.6–6.5)

## 2022-04-27 MED ORDER — TRIAMTERENE-HCTZ 37.5-25 MG PO TABS: 1.0000 | ORAL_TABLET | Freq: Every day | ORAL | 3 refills | Status: DC

## 2022-04-27 MED ORDER — FINASTERIDE 5 MG PO TABS: ORAL_TABLET | ORAL | 4 refills | Status: DC

## 2022-04-27 NOTE — Progress Notes (Signed)
Subjective:  Patient ID: Theresa Terry, female    DOB: 1951-03-24  Age: 71 y.o. MRN: [id number]  CC: Annual Exam   HPI Theresa Terry presents for a well exam C/o hair loss - recent Family h/o hair loss  Outpatient Medications Prior to Visit  Medication Sig Dispense Refill   albuterol (PROVENTIL HFA;VENTOLIN HFA) 108 (90 Base) MCG/ACT inhaler Inhale 1-2 puffs into the lungs every 6 (six) hours as needed for wheezing. 1 Inhaler 3   albuterol (PROVENTIL) (2.5 MG/3ML) 0.083% nebulizer solution Take 3 mLs (2.5 mg total) by nebulization 3 (three) times daily as needed for wheezing or shortness of breath. 50 vial 6   budesonide-formoterol (SYMBICORT) 160-4.5 MCG/ACT inhaler Inhale 2 puffs into the lungs 2 (two) times daily. 1 Inhaler 12   Cholecalciferol (VITAMIN D3) 2000 units capsule Take 1 capsule (2,000 Units total) by mouth daily. 100 capsule 3   esomeprazole (NEXIUM) 20 MG capsule Take 1 capsule (20 mg total) by mouth daily at 12 noon. 90 capsule 3   fluticasone (FLONASE) 50 MCG/ACT nasal spray Place 2 sprays into both nostrils daily. 48 g 3   loratadine (CLARITIN) 10 MG tablet Take 10 mg by mouth daily.     Multiple Vitamin (MULTIVITAMIN) tablet Take 1 tablet by mouth daily.     triamterene-hydrochlorothiazide (MAXZIDE-25) 37.5-25 MG tablet Take 1 tablet by mouth daily. Annual appt is due in must see provider for future refills 90 tablet 3   azithromycin (ZITHROMAX Z-PAK) 250 MG tablet As directed (Patient not taking: Reported on 04/27/2022) 6 tablet 0   No facility-administered medications prior to visit.    ROS: Review of Systems  Constitutional:  Negative for activity change, appetite change, chills, fatigue and unexpected weight change.  HENT:  Negative for congestion, mouth sores and sinus pressure.   Eyes:  Negative for visual disturbance.  Respiratory:  Negative for cough and chest tightness.   Gastrointestinal:  Negative for abdominal pain and nausea.   Genitourinary:  Negative for difficulty urinating, frequency and vaginal pain.  Musculoskeletal:  Negative for back pain and gait problem.  Skin:  Negative for pallor and rash.  Neurological:  Negative for dizziness, tremors, weakness, numbness and headaches.  Psychiatric/Behavioral:  Negative for confusion and sleep disturbance.     Objective:  BP 120/88 (BP Location: Left Arm)   Pulse 80   Temp 98.6 F (37 C) (Oral)   Ht 5\' 2"  (1.575 m)   Wt 173 lb 12.8 oz (78.8 kg)   SpO2 96%   BMI 31.79 kg/m   BP Readings from Last 3 Encounters:  04/27/22 120/88  02/17/21 132/80  02/12/21 130/78    Wt Readings from Last 3 Encounters:  04/27/22 173 lb 12.8 oz (78.8 kg)  02/17/21 172 lb 9.6 oz (78.3 kg)  02/12/21 174 lb (78.9 kg)    Physical Exam Constitutional:      General: She is not in acute distress.    Appearance: She is well-developed.  HENT:     Head: Normocephalic.     Right Ear: External ear normal.     Left Ear: External ear normal.     Nose: Nose normal.  Eyes:     General:        Right eye: No discharge.        Left eye: No discharge.     Conjunctiva/sclera: Conjunctivae normal.     Pupils: Pupils are equal, round, and reactive to light.  Neck:     Thyroid: No  thyromegaly.     Vascular: No JVD.     Trachea: No tracheal deviation.  Cardiovascular:     Rate and Rhythm: Normal rate and regular rhythm.     Heart sounds: Normal heart sounds.  Pulmonary:     Effort: No respiratory distress.     Breath sounds: No stridor. No wheezing.  Abdominal:     General: Bowel sounds are normal. There is no distension.     Palpations: Abdomen is soft. There is no mass.     Tenderness: There is no abdominal tenderness. There is no guarding or rebound.  Musculoskeletal:        General: No tenderness.     Cervical back: Normal range of motion and neck supple. No rigidity.  Lymphadenopathy:     Cervical: No cervical adenopathy.  Skin:    Findings: No erythema or rash.   Neurological:     Mental Status: She is oriented to person, place, and time.     Cranial Nerves: No cranial nerve deficit.     Motor: No abnormal muscle tone.     Coordination: Coordination normal.     Deep Tendon Reflexes: Reflexes normal.  Psychiatric:        Behavior: Behavior normal.        Thought Content: Thought content normal.        Judgment: Judgment normal.   alopecia  Lab Results  Component Value Date   WBC 6.9 04/27/2022   HGB 14.8 04/27/2022   HCT 44.7 04/27/2022   PLT 278.0 04/27/2022   GLUCOSE 110 (H) 04/27/2022   CHOL 181 04/27/2022   TRIG 135.0 04/27/2022   HDL 76.20 04/27/2022   LDLCALC 78 04/27/2022   ALT 14 04/27/2022   AST 16 04/27/2022   NA 140 04/27/2022   K 4.1 04/27/2022   CL 106 04/27/2022   CREATININE 0.76 04/27/2022   BUN 14 04/27/2022   CO2 27 04/27/2022   TSH 0.91 04/27/2022   HGBA1C 6.0 04/27/2022    MM 3D SCREEN BREAST BILATERAL  Result Date: 02/08/2021 CLINICAL DATA:  Screening. EXAM: DIGITAL SCREENING BILATERAL MAMMOGRAM WITH TOMOSYNTHESIS AND CAD TECHNIQUE: Bilateral screening digital craniocaudal and mediolateral oblique mammograms were obtained. Bilateral screening digital breast tomosynthesis was performed. The images were evaluated with computer-aided detection. COMPARISON:  Previous exam(s). ACR Breast Density Category a: The breast tissue is almost entirely fatty. FINDINGS: There are no findings suspicious for malignancy. IMPRESSION: No mammographic evidence of malignancy. A result letter of this screening mammogram will be mailed directly to the patient. RECOMMENDATION: Screening mammogram in one year. (Code:SM-B-01Y) BI-RADS CATEGORY  1: Negative. Electronically Signed   By: Frederico Hamman M.D.   On: 02/08/2021 10:06    Assessment & Plan:   Problem List Items Addressed This Visit     Alopecia    C/o hair loss - recent Family h/o hair loss Options discussed Start Finasteride      Relevant Orders   TSH (Completed)    Urinalysis (Completed)   CBC with Differential/Platelet (Completed)   Lipid panel (Completed)   Comprehensive metabolic panel (Completed)   Hemoglobin A1c (Completed)   PTH, intact and calcium   Essential hypertension    Cont w/Maxzide       Relevant Medications   triamterene-hydrochlorothiazide (MAXZIDE-25) 37.5-25 MG tablet   Other Relevant Orders   TSH (Completed)   Urinalysis (Completed)   CBC with Differential/Platelet (Completed)   Lipid panel (Completed)   Comprehensive metabolic panel (Completed)   Hemoglobin A1c (Completed)  PTH, intact and calcium   Hyperparathyroidism (HCC)    Check Ca, PTH      Relevant Orders   TSH (Completed)   Urinalysis (Completed)   CBC with Differential/Platelet (Completed)   Lipid panel (Completed)   Comprehensive metabolic panel (Completed)   Hemoglobin A1c (Completed)   PTH, intact and calcium   Well adult exam - Primary     We discussed age appropriate health related issues, including available/recomended screening tests and vaccinations. Labs were ordered to be later reviewed . All questions were answered. We discussed one or more of the following - seat belt use, use of sunscreen/sun exposure exercise, fall risk reduction, second hand smoke exposure, firearm use and storage, seat belt use, a need for adhering to healthy diet and exercise. Labs were ordered.  All questions were answered. Pt declined vaccines See optometry        Relevant Orders   TSH (Completed)   Urinalysis (Completed)   CBC with Differential/Platelet (Completed)   Lipid panel (Completed)   Comprehensive metabolic panel (Completed)   Other Visit Diagnoses     Hyperglycemia       Relevant Orders   Hemoglobin A1c (Completed)   Colon cancer screening       Relevant Orders   Cologuard         Meds ordered this encounter  Medications   finasteride (PROSCAR) 5 MG tablet    Sig: Take 1/4 tab daily for hair    Dispense:  30 tablet    Refill:  4    triamterene-hydrochlorothiazide (MAXZIDE-25) 37.5-25 MG tablet    Sig: Take 1 tablet by mouth daily. Annual appt is due in must see provider for future refills    Dispense:  90 tablet    Refill:  3      Follow-up: Return in about 6 months (around 10/27/2022) for a follow-up visit.  Sonda Primes, MD

## 2022-04-27 NOTE — Assessment & Plan Note (Signed)
C/o hair loss - recent Family h/o hair loss Options discussed Start Finasteride

## 2022-04-27 NOTE — Assessment & Plan Note (Addendum)
  We discussed age appropriate health related issues, including available/recomended screening tests and vaccinations. Labs were ordered to be later reviewed . All questions were answered. We discussed one or more of the following - seat belt use, use of sunscreen/sun exposure exercise, fall risk reduction, second hand smoke exposure, firearm use and storage, seat belt use, a need for adhering to healthy diet and exercise. Labs were ordered.  All questions were answered. Pt declined vaccines See optometry

## 2022-04-27 NOTE — Assessment & Plan Note (Signed)
Check Ca, PTH

## 2022-04-27 NOTE — Assessment & Plan Note (Signed)
Cont w/Maxzide ?

## 2022-04-29 LAB — PTH, INTACT AND CALCIUM
Calcium: 10.2 mg/dL (ref 8.6–10.4)
PTH: 42 pg/mL (ref 16–77)

## 2022-08-31 ENCOUNTER — Ambulatory Visit (INDEPENDENT_AMBULATORY_CARE_PROVIDER_SITE_OTHER): Payer: Medicare HMO

## 2022-08-31 VITALS — Ht 62.0 in | Wt 171.0 lb

## 2022-08-31 DIAGNOSIS — Z1211 Encounter for screening for malignant neoplasm of colon: Secondary | ICD-10-CM | POA: Diagnosis not present

## 2022-08-31 DIAGNOSIS — Z0000 Encounter for general adult medical examination without abnormal findings: Secondary | ICD-10-CM | POA: Diagnosis not present

## 2022-08-31 DIAGNOSIS — Z1239 Encounter for other screening for malignant neoplasm of breast: Secondary | ICD-10-CM | POA: Diagnosis not present

## 2022-08-31 NOTE — Progress Notes (Signed)
I connected with  Theresa Terry on 08/31/2022 at 2:30 p.m. EST by telephone and verified that I am speaking with the correct person using two identifiers.  Location: Patient: Home Provider: [place] Persons participating in the virtual visit: [place]   I discussed the limitations, risks, security and privacy concerns of performing an evaluation and management service by telephone and the availability of in person appointments. The patient expressed understanding and agreed to proceed.  Interactive audio and video telecommunications were attempted between this nurse and patient, however failed, due to patient having technical difficulties OR patient did not have access to video capability.  We continued and completed visit with audio only.  Some vital signs may be absent or patient reported.   Sheral Flow, LPN  Subjective:   Theresa Terry is a 72 y.o. female who presents for Medicare Annual (Subsequent) preventive examination.  Review of Systems     Cardiac Risk Factors include: advanced age (>34mn, >>77women);family history of premature cardiovascular disease;hypertension;obesity (BMI >30kg/m2)     Objective:    Today's Vitals   08/31/22 1439  Weight: 171 lb (77.6 kg)  Height: '5\' 2"'$  (1.575 m)  PainSc: 0-No pain   Body mass index is 31.28 kg/m.     08/31/2022    2:41 PM 08/27/2021   10:45 AM 08/14/2020    3:23 PM 10/11/2017    1:40 PM  Advanced Directives  Does Patient Have a Medical Advance Directive? No No No No  Would patient like information on creating a medical advance directive? Yes (MAU/Ambulatory/Procedural Areas - Information given) No - Patient declined Yes (MAU/Ambulatory/Procedural Areas - Information given) Yes (ED - Information included in AVS)    Current Medications (verified) Outpatient Encounter Medications as of 08/31/2022  Medication Sig   albuterol (PROVENTIL HFA;VENTOLIN HFA) 108 (90 Base) MCG/ACT inhaler Inhale  1-2 puffs into the lungs every 6 (six) hours as needed for wheezing.   albuterol (PROVENTIL) (2.5 MG/3ML) 0.083% nebulizer solution Take 3 mLs (2.5 mg total) by nebulization 3 (three) times daily as needed for wheezing or shortness of breath.   budesonide-formoterol (SYMBICORT) 160-4.5 MCG/ACT inhaler Inhale 2 puffs into the lungs 2 (two) times daily.   Cholecalciferol (VITAMIN D3) 2000 units capsule Take 1 capsule (2,000 Units total) by mouth daily.   esomeprazole (NEXIUM) 20 MG capsule Take 1 capsule (20 mg total) by mouth daily at 12 noon.   finasteride (PROSCAR) 5 MG tablet Take 1/4 tab daily for hair   fluticasone (FLONASE) 50 MCG/ACT nasal spray Place 2 sprays into both nostrils daily.   loratadine (CLARITIN) 10 MG tablet Take 10 mg by mouth daily.   Multiple Vitamin (MULTIVITAMIN) tablet Take 1 tablet by mouth daily.   triamterene-hydrochlorothiazide (MAXZIDE-25) 37.5-25 MG tablet Take 1 tablet by mouth daily. Annual appt is due in must see provider for future refills   No facility-administered encounter medications on file as of 08/31/2022.    Allergies (verified) Patient has no known allergies.   History: Past Medical History:  Diagnosis Date   Allergy    Arthritis    Asthma    Cataract    GERD (gastroesophageal reflux disease)    Hypertension    Past Surgical History:  Procedure Laterality Date   ABDOMINAL HYSTERECTOMY     complete   TONSILLECTOMY AND ADENOIDECTOMY  1961   Family History  Problem Relation Age of Onset   Hypertension Mother    Heart failure Mother    Alcohol abuse Mother  Heart disease Mother    Alcohol abuse Father    Heart disease Father    Alcohol abuse Brother    Heart disease Maternal Aunt    Heart disease Maternal Uncle    Social History   Socioeconomic History   Marital status: Divorced    Spouse name: Not on file   Number of children: 4   Years of education: Not on file   Highest education level: Not on file  Occupational History    Not on file  Tobacco Use   Smoking status: Never    Passive exposure: Yes   Smokeless tobacco: Never  Vaping Use   Vaping Use: Never used  Substance and Sexual Activity   Alcohol use: Yes    Alcohol/week: 0.0 standard drinks of alcohol   Drug use: No   Sexual activity: Not Currently  Other Topics Concern   Not on file  Social History Narrative   Not on file   Social Determinants of Health   Financial Resource Strain: Low Risk  (08/31/2022)   Overall Financial Resource Strain (CARDIA)    Difficulty of Paying Living Expenses: Not hard at all  Food Insecurity: No Food Insecurity (08/31/2022)   Hunger Vital Sign    Worried About Running Out of Food in the Last Year: Never true    Ran Out of Food in the Last Year: Never true  Transportation Needs: No Transportation Needs (08/31/2022)   PRAPARE - Hydrologist (Medical): No    Lack of Transportation (Non-Medical): No  Physical Activity: Sufficiently Active (08/31/2022)   Exercise Vital Sign    Days of Exercise per Week: 5 days    Minutes of Exercise per Session: 30 min  Stress: No Stress Concern Present (08/31/2022)   [place]    Feeling of Stress : Not at all  Social Connections: Moderately Integrated (08/31/2022)   Social Connection and Isolation Panel [NHANES]    Frequency of Communication with Friends and Family: More than three times a week    Frequency of Social Gatherings with Friends and Family: Once a week    Attends Religious Services: 1 to 4 times per year    Active Member of [employer] or Organizations: Yes    Attends Archivist Meetings: 1 to 4 times per year    Marital Status: Divorced    Tobacco Counseling Counseling given: Not Answered   Clinical Intake:  Pre-visit preparation completed: Yes  Pain : No/denies pain Pain Score: 0-No pain     BMI - recorded: 31.28 Nutritional Status: BMI > 30   Obese Nutritional Risks: None Diabetes: No  How often do you need to have someone help you when you read instructions, pamphlets, or other written materials from your doctor or pharmacy?: 1 - Never What is the last grade level you completed in school?: HSG; 1 year of college at [employer]  Diabetic? No  Interpreter Needed?: No  Information entered by :: Lisette Abu, LPN.   Activities of Daily Living    08/31/2022    2:44 PM  In your present state of health, do you have any difficulty performing the following activities:  Hearing? 0  Vision? 0  Difficulty concentrating or making decisions? 0  Walking or climbing stairs? 0  Dressing or bathing? 0  Doing errands, shopping? 0  Preparing Food and eating ? N  Using the Toilet? N  In the past six months, have you accidently  leaked urine? N  Do you have problems with loss of bowel control? N  Managing your Medications? N  Managing your Finances? N  Housekeeping or managing your Housekeeping? N    Patient Care Team: Plotnikov, Evie Lacks, MD as PCP - General (Internal Medicine) [facility] as Consulting Physician (Optometry)  Indicate any recent Medical Services you may have received from other than [facility] providers in the past year (date may be approximate).     Assessment:   This is a routine wellness examination for [place].  Hearing/Vision screen Hearing Screening - Comments:: Denies hearing difficulties   Vision Screening - Comments:: Wears otc reading glasses - in the process of changing eye doctor due to insurance change.  Dietary issues and exercise activities discussed: Current Exercise Habits: Home exercise routine, Type of exercise: walking;Other - see comments (bike), Time (Minutes): 30, Frequency (Times/Week): 5, Weekly Exercise (Minutes/Week): 150, Intensity: Moderate, Exercise limited by: None identified   Goals Addressed               This Visit's Progress     Patient Stated (pt-stated)         My goal is to lose 10-15 pounds.      Depression Screen    08/31/2022    2:44 PM 04/27/2022   10:54 AM 08/27/2021   10:53 AM 02/17/2021    8:59 AM 08/14/2020    3:18 PM 09/21/2019   11:36 AM 10/11/2017    1:40 PM  PHQ 2/9 Scores  PHQ - 2 Score 0 0 0 0 0 0 0  PHQ- 9 Score  1  2   0    Fall Risk    08/31/2022    2:43 PM 04/27/2022   10:54 AM 08/27/2021   10:48 AM 02/17/2021    8:59 AM 08/14/2020    3:23 PM  Fall Risk   Falls in the past year? 0 0 0 0 0  Number falls in past yr: 0 0 0 0 0  Injury with Fall? 0 0 0 0 0  Risk for fall due to : No Fall Risks No Fall Risks No Fall Risks No Fall Risks No Fall Risks  Follow up Falls prevention discussed  Falls evaluation completed  Falls evaluation completed    FALL RISK PREVENTION PERTAINING TO THE HOME:  Any stairs in or around the home? No  If so, are there any without handrails? No  Home free of loose throw rugs in walkways, pet beds, electrical cords, etc? Yes  Adequate lighting in your home to reduce risk of falls? Yes   ASSISTIVE DEVICES UTILIZED TO PREVENT FALLS:  Life alert? No  Use of a cane, walker or w/c? No  Grab bars in the bathroom? Yes  Shower chair or bench in shower? No  Elevated toilet seat or a handicapped toilet? Yes   TIMED UP AND GO:  Was the test performed? No . Telephonic Visit   Cognitive Function:        08/31/2022    2:43 PM  6CIT Screen  What Year? 0 points  What month? 0 points  What time? 0 points  Count back from 20 0 points  Months in reverse 0 points  Repeat phrase 0 points  Total Score 0 points    Immunizations Immunization History  Administered Date(s) Administered   PFIZER(Purple Top)SARS-COV-2 Vaccination 09/16/2019, 10/07/2019   Tdap 03/11/2018    TDAP status: Up to date  Flu Vaccine status: Declined, Education has been  provided regarding the importance of this vaccine but patient still declined. Advised may receive this vaccine at local pharmacy or Health Dept. Aware to  provide a copy of the vaccination record if obtained from local pharmacy or Health Dept. Verbalized acceptance and understanding.  Pneumococcal vaccine status: Declined,  Education has been provided regarding the importance of this vaccine but patient still declined. Advised may receive this vaccine at local pharmacy or Health Dept. Aware to provide a copy of the vaccination record if obtained from local pharmacy or Health Dept. Verbalized acceptance and understanding.   Covid-19 vaccine status: Completed vaccines  Qualifies for Shingles Vaccine? Yes   Zostavax completed No   Shingrix Completed?: No.    Education has been provided regarding the importance of this vaccine. Patient has been advised to call insurance company to determine out of pocket expense if they have not yet received this vaccine. Advised may also receive vaccine at local pharmacy or Health Dept. Verbalized acceptance and understanding.  Screening Tests Health Maintenance  Topic Date Due   COLONOSCOPY (Pts 45-43yr Insurance coverage will need to be confirmed)  Never done   Zoster Vaccines- Shingrix (1 of 2) Never done   Pneumonia Vaccine 74 Years old (1 of 1 - PCV) Never done   COVID-19 Vaccine (3 - 2023-24 season) 02/27/2022   INFLUENZA VACCINE  09/27/2022 (Originally 01/27/2022)   MAMMOGRAM  02/07/2023   Medicare Annual Wellness (AWV)  08/31/2023   DTaP/Tdap/Td (2 - Td or Tdap) 03/11/2028   DEXA SCAN  Completed   Hepatitis C Screening  Completed   HPV VACCINES  Aged Out    Health Maintenance  Health Maintenance Due  Topic Date Due   COLONOSCOPY (Pts 45-428yrInsurance coverage will need to be confirmed)  Never done   Zoster Vaccines- Shingrix (1 of 2) Never done   Pneumonia Vaccine 6526Years old (1 of 1 - PCV) Never done   COVID-19 Vaccine (3 - 2023-24 season) 02/27/2022    Colorectal cancer screening: Cologuard order placed: 08/31/2022  Mammogram status: Ordered 08/31/2022. Pt provided with contact info and  advised to call to schedule appt.   Bone Density status: Completed 11/12/2017. Results reflect: Bone density results: OSTEOPOROSIS. Repeat every 2 years.-Patient declined  Lung Cancer Screening: (Low Dose CT Chest recommended if Age 74-80ears, 30 pack-year currently smoking OR have quit w/in 15years.) does not qualify.   Lung Cancer Screening Referral: no  Additional Screening:  Hepatitis C Screening: does qualify; Completed 12/02/2015  Vision Screening: Recommended annual ophthalmology exams for early detection of glaucoma and other disorders of the eye. Is the patient up to date with their annual eye exam?  No  Who is the provider or what is the name of the office in which the patient attends annual eye exams? In process of looking for provider due to insurance If pt is not established with a provider, would they like to be referred to a provider to establish care? No .   Dental Screening: Recommended annual dental exams for proper oral hygiene  Community Resource Referral / Chronic Care Management: CRR required this visit?  No   CCM required this visit?  No      Plan:     I have personally reviewed and noted the following in the patient's chart:   Medical and social history Use of alcohol, tobacco or illicit drugs  Current medications and supplements including opioid prescriptions. Patient is not currently taking opioid prescriptions. Functional ability and status Nutritional status Physical  activity Advanced directives List of other physicians Hospitalizations, surgeries, and ER visits in previous 12 months Vitals Screenings to include cognitive, depression, and falls Referrals and appointments  In addition, I have reviewed and discussed with patient certain preventive protocols, quality metrics, and best practice recommendations. A written personalized care plan for preventive services as well as general preventive health recommendations were provided to patient.      Sheral Flow, LPN   [id number]   Nurse Notes:  Normal cognitive status assessed by direct observation by this Nurse Health Advisor. No abnormalities found.

## 2022-08-31 NOTE — Patient Instructions (Addendum)
Theresa Terry , Thank you for taking time to come for your Medicare Wellness Visit. I appreciate your ongoing commitment to your health goals. Please review the following plan we discussed and let me know if I can assist you in the future.   These are the goals we discussed:  Goals       Patient Stated (pt-stated)      My goal is to lose 10-15 pounds.        This is a list of the screening recommended for you and due dates:  Health Maintenance  Topic Date Due   Colon Cancer Screening  Never done   Zoster (Shingles) Vaccine (1 of 2) Never done   Pneumonia Vaccine (1 of 1 - PCV) Never done   COVID-19 Vaccine (3 - 2023-24 season) 02/27/2022   Flu Shot  09/27/2022*   Mammogram  02/07/2023   Medicare Annual Wellness Visit  08/31/2023   DTaP/Tdap/Td vaccine (2 - Td or Tdap) 03/11/2028   DEXA scan (bone density measurement)  Completed   Hepatitis C Screening: USPSTF Recommendation to screen - Ages 75-79 yo.  Completed   HPV Vaccine  Aged Out  *Topic was postponed. The date shown is not the original due date.    Advanced directives: Advance directive discussed with you today. I have provided a copy for you to complete at home and have notarized. Once this is complete please bring a copy in to our office so we can scan it into your chart.  Conditions/risks identified: Yes  Next appointment: Follow up in one year for your annual wellness visit.   Preventive Care 55 Years and Older, Female Preventive care refers to lifestyle choices and visits with your health care provider that can promote health and wellness. What does preventive care include? A yearly physical exam. This is also called an annual well check. Dental exams once or twice a year. Routine eye exams. Ask your health care provider how often you should have your eyes checked. Personal lifestyle choices, including: Daily care of your teeth and gums. Regular physical activity. Eating a healthy diet. Avoiding tobacco and drug  use. Limiting alcohol use. Practicing safe sex. Taking low-dose aspirin every day. Taking vitamin and mineral supplements as recommended by your health care provider. What happens during an annual well check? The services and screenings done by your health care provider during your annual well check will depend on your age, overall health, lifestyle risk factors, and family history of disease. Counseling  Your health care provider may ask you questions about your: Alcohol use. Tobacco use. Drug use. Emotional well-being. Home and relationship well-being. Sexual activity. Eating habits. History of falls. Memory and ability to understand (cognition). Work and work Statistician. Reproductive health. Screening  You may have the following tests or measurements: Height, weight, and BMI. Blood pressure. Lipid and cholesterol levels. These may be checked every 5 years, or more frequently if you are over 32 years old. Skin check. Lung cancer screening. You may have this screening every year starting at age 61 if you have a 30-pack-year history of smoking and currently smoke or have quit within the past 15 years. Fecal occult blood test (FOBT) of the stool. You may have this test every year starting at age 47. Flexible sigmoidoscopy or colonoscopy. You may have a sigmoidoscopy every 5 years or a colonoscopy every 10 years starting at age 85. Hepatitis C blood test. Hepatitis B blood test. Sexually transmitted disease (STD) testing. Diabetes screening. This is done  by checking your blood sugar (glucose) after you have not eaten for a while (fasting). You may have this done every 1-3 years. Bone density scan. This is done to screen for osteoporosis. You may have this done starting at age 88. Mammogram. This may be done every 1-2 years. Talk to your health care provider about how often you should have regular mammograms. Talk with your health care provider about your test results, treatment  options, and if necessary, the need for more tests. Vaccines  Your health care provider may recommend certain vaccines, such as: Influenza vaccine. This is recommended every year. Tetanus, diphtheria, and acellular pertussis (Tdap, Td) vaccine. You may need a Td booster every 10 years. Zoster vaccine. You may need this after age 39. Pneumococcal 13-valent conjugate (PCV13) vaccine. One dose is recommended after age 35. Pneumococcal polysaccharide (PPSV23) vaccine. One dose is recommended after age 52. Talk to your health care provider about which screenings and vaccines you need and how often you need them. This information is not intended to replace advice given to you by your health care provider. Make sure you discuss any questions you have with your health care provider. Document Released: 07/12/2015 Document Revised: 03/04/2016 Document Reviewed: 04/16/2015 Elsevier Interactive Patient Education  2017 [employer] Prevention in the Home Falls can cause injuries. They can happen to people of all ages. There are many things you can do to make your home safe and to help prevent falls. What can I do on the outside of my home? Regularly fix the edges of walkways and driveways and fix any cracks. Remove anything that might make you trip as you walk through a door, such as a raised step or threshold. Trim any bushes or trees on the path to your home. Use bright outdoor lighting. Clear any walking paths of anything that might make someone trip, such as rocks or tools. Regularly check to see if handrails are loose or broken. Make sure that both sides of any steps have handrails. Any raised decks and porches should have guardrails on the edges. Have any leaves, snow, or ice cleared regularly. Use sand or salt on walking paths during winter. Clean up any spills in your garage right away. This includes oil or grease spills. What can I do in the bathroom? Use night lights. Install grab  bars by the toilet and in the tub and shower. Do not use towel bars as grab bars. Use non-skid mats or decals in the tub or shower. If you need to sit down in the shower, use a plastic, non-slip stool. Keep the floor dry. Clean up any water that spills on the floor as soon as it happens. Remove soap buildup in the tub or shower regularly. Attach bath mats securely with double-sided non-slip rug tape. Do not have throw rugs and other things on the floor that can make you trip. What can I do in the bedroom? Use night lights. Make sure that you have a light by your bed that is easy to reach. Do not use any sheets or blankets that are too big for your bed. They should not hang down onto the floor. Have a firm chair that has side arms. You can use this for support while you get dressed. Do not have throw rugs and other things on the floor that can make you trip. What can I do in the kitchen? Clean up any spills right away. Avoid walking on wet floors. Keep items that you use  a lot in easy-to-reach places. If you need to reach something above you, use a strong step stool that has a grab bar. Keep electrical cords out of the way. Do not use floor polish or wax that makes floors slippery. If you must use wax, use non-skid floor wax. Do not have throw rugs and other things on the floor that can make you trip. What can I do with my stairs? Do not leave any items on the stairs. Make sure that there are handrails on both sides of the stairs and use them. Fix handrails that are broken or loose. Make sure that handrails are as long as the stairways. Check any carpeting to make sure that it is firmly attached to the stairs. Fix any carpet that is loose or worn. Avoid having throw rugs at the top or bottom of the stairs. If you do have throw rugs, attach them to the floor with carpet tape. Make sure that you have a light switch at the top of the stairs and the bottom of the stairs. If you do not have them,  ask someone to add them for you. What else can I do to help prevent falls? Wear shoes that: Do not have high heels. Have rubber bottoms. Are comfortable and fit you well. Are closed at the toe. Do not wear sandals. If you use a stepladder: Make sure that it is fully opened. Do not climb a closed stepladder. Make sure that both sides of the stepladder are locked into place. Ask someone to hold it for you, if possible. Clearly mark and make sure that you can see: Any grab bars or handrails. First and last steps. Where the edge of each step is. Use tools that help you move around (mobility aids) if they are needed. These include: Canes. Walkers. Scooters. Crutches. Turn on the lights when you go into a dark area. Replace any light bulbs as soon as they burn out. Set up your furniture so you have a clear path. Avoid moving your furniture around. If any of your floors are uneven, fix them. If there are any pets around you, be aware of where they are. Review your medicines with your doctor. Some medicines can make you feel dizzy. This can increase your chance of falling. Ask your doctor what other things that you can do to help prevent falls. This information is not intended to replace advice given to you by your health care provider. Make sure you discuss any questions you have with your health care provider. Document Released: 04/11/2009 Document Revised: 11/21/2015 Document Reviewed: 07/20/2014 Elsevier Interactive Patient Education  2017 [employer].

## 2023-05-10 ENCOUNTER — Encounter: Payer: Medicare HMO | Admitting: Internal Medicine

## 2023-05-12 ENCOUNTER — Ambulatory Visit: Payer: Medicare HMO

## 2023-05-24 ENCOUNTER — Ambulatory Visit (INDEPENDENT_AMBULATORY_CARE_PROVIDER_SITE_OTHER): Payer: Medicare HMO | Admitting: Internal Medicine

## 2023-05-24 ENCOUNTER — Encounter: Payer: Self-pay | Admitting: Internal Medicine

## 2023-05-24 VITALS — BP 130/80 | HR 84 | Temp 98.5°F | Ht 62.0 in | Wt 165.0 lb

## 2023-05-24 DIAGNOSIS — I10 Essential (primary) hypertension: Secondary | ICD-10-CM

## 2023-05-24 DIAGNOSIS — Z0000 Encounter for general adult medical examination without abnormal findings: Secondary | ICD-10-CM

## 2023-05-24 DIAGNOSIS — H539 Unspecified visual disturbance: Secondary | ICD-10-CM | POA: Diagnosis not present

## 2023-05-24 DIAGNOSIS — L659 Nonscarring hair loss, unspecified: Secondary | ICD-10-CM

## 2023-05-24 LAB — URINALYSIS
Bilirubin Urine: NEGATIVE
Hgb urine dipstick: NEGATIVE
Ketones, ur: NEGATIVE
Leukocytes,Ua: NEGATIVE
Nitrite: NEGATIVE
Specific Gravity, Urine: 1.01 (ref 1.000–1.030)
Total Protein, Urine: NEGATIVE
Urine Glucose: NEGATIVE
Urobilinogen, UA: 0.2 (ref 0.0–1.0)
pH: 7 (ref 5.0–8.0)

## 2023-05-24 LAB — COMPREHENSIVE METABOLIC PANEL
ALT: 16 U/L (ref 0–35)
AST: 20 U/L (ref 0–37)
Albumin: 4.4 g/dL (ref 3.5–5.2)
Alkaline Phosphatase: 76 U/L (ref 39–117)
BUN: 14 mg/dL (ref 6–23)
CO2: 27 meq/L (ref 19–32)
Calcium: 10.9 mg/dL — ABNORMAL HIGH (ref 8.4–10.5)
Chloride: 104 meq/L (ref 96–112)
Creatinine, Ser: 0.88 mg/dL (ref 0.40–1.20)
GFR: 64.72 mL/min (ref >60.00)
Glucose, Bld: 93 mg/dL (ref 70–99)
Potassium: 4.1 meq/L (ref 3.5–5.1)
Sodium: 140 meq/L (ref 135–145)
Total Bilirubin: 0.4 mg/dL (ref 0.2–1.2)
Total Protein: 7.1 g/dL (ref 6.0–8.3)

## 2023-05-24 LAB — TSH: TSH: 0.79 u[IU]/mL (ref 0.35–5.50)

## 2023-05-24 LAB — LIPID PANEL
Cholesterol: 205 mg/dL — ABNORMAL HIGH (ref 0–200)
HDL: 75.8 mg/dL (ref >39.00)
LDL Cholesterol: 108 mg/dL — ABNORMAL HIGH (ref 0–99)
NonHDL: 128.94
Total CHOL/HDL Ratio: 3
Triglycerides: 106 mg/dL (ref 0.0–149.0)
VLDL: 21.2 mg/dL (ref 0.0–40.0)

## 2023-05-24 MED ORDER — TRIAMTERENE-HCTZ 37.5-25 MG PO TABS: 1.0000 | ORAL_TABLET | Freq: Every day | ORAL | 3 refills | Status: DC

## 2023-05-24 MED ORDER — FINASTERIDE 5 MG PO TABS: ORAL_TABLET | ORAL | 4 refills | Status: AC

## 2023-05-24 MED ORDER — PANTOPRAZOLE SODIUM 40 MG PO TBEC: 40.0000 mg | DELAYED_RELEASE_TABLET | Freq: Every day | ORAL | 3 refills | Status: DC

## 2023-05-24 NOTE — Assessment & Plan Note (Addendum)
  We discussed age appropriate health related issues, including available/recomended screening tests and vaccinations. Labs were ordered to be later reviewed . All questions were answered. We discussed one or more of the following - seat belt use, use of sunscreen/sun exposure exercise, fall risk reduction, second hand smoke exposure, firearm use and storage, seat belt use, a need for adhering to healthy diet and exercise. Labs were ordered.  All questions were answered. Pt declined vaccines See ophthalmology Cologuard next week

## 2023-05-24 NOTE — Assessment & Plan Note (Signed)
Hair loss  Family h/o hair loss Options discussed Start Finasteride again

## 2023-05-24 NOTE — Progress Notes (Signed)
Subjective:  Patient ID: Theresa Terry, female    DOB: 1951/03/25  Age: 72 y.o. MRN: [id number]  CC: Annual Exam   HPI Theresa Terry presents for a well exam Pt lost on diet  Outpatient Medications Prior to Visit  Medication Sig Dispense Refill   albuterol (PROVENTIL HFA;VENTOLIN HFA) 108 (90 Base) MCG/ACT inhaler Inhale 1-2 puffs into the lungs every 6 (six) hours as needed for wheezing. 1 Inhaler 3   albuterol (PROVENTIL) (2.5 MG/3ML) 0.083% nebulizer solution Take 3 mLs (2.5 mg total) by nebulization 3 (three) times daily as needed for wheezing or shortness of breath. 50 vial 6   Cholecalciferol (VITAMIN D3) 2000 units capsule Take 1 capsule (2,000 Units total) by mouth daily. 100 capsule 3   fluticasone (FLONASE) 50 MCG/ACT nasal spray Place 2 sprays into both nostrils daily. 48 g 3   loratadine (CLARITIN) 10 MG tablet Take 10 mg by mouth daily.     Multiple Vitamin (MULTIVITAMIN) tablet Take 1 tablet by mouth daily.     budesonide-formoterol (SYMBICORT) 160-4.5 MCG/ACT inhaler Inhale 2 puffs into the lungs 2 (two) times daily. 1 Inhaler 12   esomeprazole (NEXIUM) 20 MG capsule Take 1 capsule (20 mg total) by mouth daily at 12 noon. 90 capsule 3   finasteride (PROSCAR) 5 MG tablet Take 1/4 tab daily for hair 30 tablet 4   triamterene-hydrochlorothiazide (MAXZIDE-25) 37.5-25 MG tablet Take 1 tablet by mouth daily. Annual appt is due in must see provider for future refills 90 tablet 3   No facility-administered medications prior to visit.    ROS: Review of Systems  Constitutional:  Negative for activity change, appetite change, chills, fatigue and unexpected weight change.  HENT:  Negative for congestion, mouth sores and sinus pressure.   Eyes:  Negative for visual disturbance.  Respiratory:  Negative for cough and chest tightness.   Gastrointestinal:  Negative for abdominal pain and nausea.  Genitourinary:  Negative for difficulty urinating, frequency and vaginal pain.   Musculoskeletal:  Negative for back pain and gait problem.  Skin:  Negative for pallor and rash.  Neurological:  Negative for dizziness, tremors, weakness, numbness and headaches.  Psychiatric/Behavioral:  Negative for confusion and sleep disturbance.     Objective:  BP 130/80 (BP Location: Left Arm, Patient Position: Sitting, Cuff Size: Normal)   Pulse 84   Temp 98.5 F (36.9 C) (Oral)   Ht 5\' 2"  (1.575 m)   Wt 165 lb (74.8 kg)   SpO2 97%   BMI 30.18 kg/m   BP Readings from Last 3 Encounters:  05/24/23 130/80  04/27/22 120/88  02/17/21 132/80    Wt Readings from Last 3 Encounters:  05/24/23 165 lb (74.8 kg)  08/31/22 171 lb (77.6 kg)  04/27/22 173 lb 12.8 oz (78.8 kg)    Physical Exam Constitutional:      General: She is not in acute distress.    Appearance: She is well-developed. She is obese.  HENT:     Head: Normocephalic.     Right Ear: External ear normal.     Left Ear: External ear normal.     Nose: Nose normal.  Eyes:     General:        Right eye: No discharge.        Left eye: No discharge.     Conjunctiva/sclera: Conjunctivae normal.     Pupils: Pupils are equal, round, and reactive to light.  Neck:     Thyroid: No thyromegaly.  Vascular: No JVD.     Trachea: No tracheal deviation.  Cardiovascular:     Rate and Rhythm: Normal rate and regular rhythm.     Heart sounds: Normal heart sounds.  Pulmonary:     Effort: No respiratory distress.     Breath sounds: No stridor. No wheezing.  Abdominal:     General: Bowel sounds are normal. There is no distension.     Palpations: Abdomen is soft. There is no mass.     Tenderness: There is no abdominal tenderness. There is no guarding or rebound.  Musculoskeletal:        General: No tenderness.     Cervical back: Normal range of motion and neck supple. No rigidity.  Lymphadenopathy:     Cervical: No cervical adenopathy.  Skin:    Findings: No erythema or rash.  Neurological:     Mental Status: She  is oriented to person, place, and time.     Cranial Nerves: No cranial nerve deficit.     Motor: No abnormal muscle tone.     Coordination: Coordination normal.     Deep Tendon Reflexes: Reflexes normal.  Psychiatric:        Behavior: Behavior normal.        Thought Content: Thought content normal.        Judgment: Judgment normal.     Lab Results  Component Value Date   WBC 6.9 04/27/2022   HGB 14.8 04/27/2022   HCT 44.7 04/27/2022   PLT 278.0 04/27/2022   GLUCOSE 110 (H) 04/27/2022   CHOL 181 04/27/2022   TRIG 135.0 04/27/2022   HDL 76.20 04/27/2022   LDLCALC 78 04/27/2022   ALT 14 04/27/2022   AST 16 04/27/2022   NA 140 04/27/2022   K 4.1 04/27/2022   CL 106 04/27/2022   CREATININE 0.76 04/27/2022   BUN 14 04/27/2022   CO2 27 04/27/2022   TSH 0.91 04/27/2022   HGBA1C 6.0 04/27/2022    MM 3D SCREEN BREAST BILATERAL  Result Date: 02/08/2021 CLINICAL DATA:  Screening. EXAM: DIGITAL SCREENING BILATERAL MAMMOGRAM WITH TOMOSYNTHESIS AND CAD TECHNIQUE: Bilateral screening digital craniocaudal and mediolateral oblique mammograms were obtained. Bilateral screening digital breast tomosynthesis was performed. The images were evaluated with computer-aided detection. COMPARISON:  Previous exam(s). ACR Breast Density Category a: The breast tissue is almost entirely fatty. FINDINGS: There are no findings suspicious for malignancy. IMPRESSION: No mammographic evidence of malignancy. A result letter of this screening mammogram will be mailed directly to the patient. RECOMMENDATION: Screening mammogram in one year. (Code:SM-B-01Y) BI-RADS CATEGORY  1: Negative. Electronically Signed   By: Frederico Hamman M.D.   On: 02/08/2021 10:06    Assessment & Plan:   Problem List Items Addressed This Visit     Essential hypertension    Cont w/Maxzide       Relevant Medications   triamterene-hydrochlorothiazide (MAXZIDE-25) 37.5-25 MG tablet   Well adult exam     We discussed age  appropriate health related issues, including available/recomended screening tests and vaccinations. Labs were ordered to be later reviewed . All questions were answered. We discussed one or more of the following - seat belt use, use of sunscreen/sun exposure exercise, fall risk reduction, second hand smoke exposure, firearm use and storage, seat belt use, a need for adhering to healthy diet and exercise. Labs were ordered.  All questions were answered. Pt declined vaccines See ophthalmology Cologuard next week       Relevant Orders   TSH  Urinalysis   CBC with Differential/Platelet   Lipid panel   Comprehensive metabolic panel   Alopecia    Hair loss  Family h/o hair loss Options discussed Start Finasteride again      Other Visit Diagnoses     Vision changes    -  Primary   Relevant Orders   Ambulatory referral to Ophthalmology         Meds ordered this encounter  Medications   pantoprazole (PROTONIX) 40 MG tablet    Sig: Take 1 tablet (40 mg total) by mouth daily.    Dispense:  90 tablet    Refill:  3   triamterene-hydrochlorothiazide (MAXZIDE-25) 37.5-25 MG tablet    Sig: Take 1 tablet by mouth daily. Annual appt is due in must see provider for future refills    Dispense:  90 tablet    Refill:  3   finasteride (PROSCAR) 5 MG tablet    Sig: Take 1/4 tab daily for hair    Dispense:  30 tablet    Refill:  4    For hair loss      Follow-up: No follow-ups on file.  Sonda Primes, MD

## 2023-05-24 NOTE — Assessment & Plan Note (Signed)
Cont w/Maxzide ?

## 2023-05-25 LAB — CBC WITH DIFFERENTIAL/PLATELET
Basophils Absolute: 0.1 10*3/uL (ref 0.0–0.1)
Basophils Relative: 1.2 % (ref 0.0–3.0)
Eosinophils Absolute: 0.1 10*3/uL (ref 0.0–0.7)
Eosinophils Relative: 1.6 % (ref 0.0–5.0)
HCT: 46 % (ref 36.0–46.0)
Hemoglobin: 15.2 g/dL — ABNORMAL HIGH (ref 12.0–15.0)
Lymphocytes Relative: 26.2 % (ref 12.0–46.0)
Lymphs Abs: 2 10*3/uL (ref 0.7–4.0)
MCHC: 33 g/dL (ref 30.0–36.0)
MCV: 95.6 fL (ref 78.0–100.0)
Monocytes Absolute: 0.6 10*3/uL (ref 0.1–1.0)
Monocytes Relative: 7.7 % (ref 3.0–12.0)
Neutro Abs: 4.8 10*3/uL (ref 1.4–7.7)
Neutrophils Relative %: 63.3 % (ref 43.0–77.0)
Platelets: 291 10*3/uL (ref 150.0–400.0)
RBC: 4.81 Mil/uL (ref 3.87–5.11)
RDW: 13.5 % (ref 11.5–15.5)
WBC: 7.6 10*3/uL (ref 4.0–10.5)

## 2023-06-02 ENCOUNTER — Ambulatory Visit: Payer: Medicare HMO

## 2023-07-09 ENCOUNTER — Ambulatory Visit: Payer: Medicare HMO

## 2023-07-21 ENCOUNTER — Ambulatory Visit: Payer: Medicare HMO

## 2023-10-28 ENCOUNTER — Ambulatory Visit

## 2023-10-28 VITALS — Ht 62.0 in | Wt 165.0 lb

## 2023-10-28 DIAGNOSIS — Z1231 Encounter for screening mammogram for malignant neoplasm of breast: Secondary | ICD-10-CM

## 2023-10-28 DIAGNOSIS — Z0000 Encounter for general adult medical examination without abnormal findings: Secondary | ICD-10-CM | POA: Diagnosis not present

## 2023-10-28 NOTE — Progress Notes (Cosign Needed Addendum)
 Subjective:   Theresa Terry is a 73 y.o. who presents for a Medicare Wellness preventive visit.  Visit Complete: Virtual I connected with  Theresa Terry on 10/28/23 by a audio enabled telemedicine application and verified that I am speaking with the correct person using two identifiers.  Patient Location: Home  Provider Location: Office/Clinic  I discussed the limitations of evaluation and management by telemedicine. The patient expressed understanding and agreed to proceed.  Vital Signs: Because this visit was a virtual/telehealth visit, some criteria may be missing or patient reported. Any vitals not documented were not able to be obtained and vitals that have been documented are patient reported.  VideoDeclined- This patient declined Librarian, academic. Therefore the visit was completed with audio only.  Persons Participating in Visit: Patient.  AWV Questionnaire: No: Patient Medicare AWV questionnaire was not completed prior to this visit.  Cardiac Risk Factors include: advanced age (>58men, >60 women);hypertension     Objective:    Today's Vitals   10/28/23 1233  Weight: 165 lb (74.8 kg)  Height: 5\' 2"  (1.575 m)   Body mass index is 30.18 kg/m.     10/28/2023    1:12 PM 08/31/2022    2:41 PM 08/27/2021   10:45 AM 08/14/2020    3:23 PM 10/11/2017    1:40 PM  Advanced Directives  Does Patient Have a Medical Advance Directive? No No No No No  Would patient like information on creating a medical advance directive? Yes (MAU/Ambulatory/Procedural Areas - Information given) Yes (MAU/Ambulatory/Procedural Areas - Information given) No - Patient declined Yes (MAU/Ambulatory/Procedural Areas - Information given) Yes (ED - Information included in AVS)    Current Medications (verified) Outpatient Encounter Medications as of 10/28/2023  Medication Sig   albuterol  (PROVENTIL  HFA;VENTOLIN  HFA) 108 (90 Base) MCG/ACT inhaler Inhale 1-2 puffs into the  lungs every 6 (six) hours as needed for wheezing.   albuterol  (PROVENTIL ) (2.5 MG/3ML) 0.083% nebulizer solution Take 3 mLs (2.5 mg total) by nebulization 3 (three) times daily as needed for wheezing or shortness of breath.   Cholecalciferol (VITAMIN D3) 2000 units capsule Take 1 capsule (2,000 Units total) by mouth daily.   finasteride  (PROSCAR ) 5 MG tablet Take 1/4 tab daily for hair   fluticasone  (FLONASE ) 50 MCG/ACT nasal spray Place 2 sprays into both nostrils daily.   loratadine (CLARITIN) 10 MG tablet Take 10 mg by mouth daily.   Multiple Vitamin (MULTIVITAMIN) tablet Take 1 tablet by mouth daily.   pantoprazole  (PROTONIX ) 40 MG tablet Take 1 tablet (40 mg total) by mouth daily.   triamterene -hydrochlorothiazide (MAXZIDE-25) 37.5-25 MG tablet Take 1 tablet by mouth daily. Annual appt is due in must see provider for future refills   No facility-administered encounter medications on file as of 10/28/2023.    Allergies (verified) Patient has no known allergies.   History: Past Medical History:  Diagnosis Date   Allergy    Arthritis    Asthma    Cataract    GERD (gastroesophageal reflux disease)    Hypertension    Past Surgical History:  Procedure Laterality Date   ABDOMINAL HYSTERECTOMY     complete   TONSILLECTOMY AND ADENOIDECTOMY  1961   Family History  Problem Relation Age of Onset   Hypertension Mother    Heart failure Mother    Alcohol abuse Mother    Heart disease Mother    Alcohol abuse Father    Heart disease Father    Alcohol abuse Brother  Heart disease Maternal Aunt    Heart disease Maternal Uncle    Social History   Socioeconomic History   Marital status: Divorced    Spouse name: Not on file   Number of children: 4   Years of education: Not on file   Highest education level: Some college, no degree  Occupational History   Occupation: RETIRED  Tobacco Use   Smoking status: Never    Passive exposure: Yes   Smokeless tobacco: Never  Vaping Use    Vaping status: Never Used  Substance and Sexual Activity   Alcohol use: Yes    Alcohol/week: 0.0 standard drinks of alcohol   Drug use: No   Sexual activity: Not Currently  Other Topics Concern   Not on file  Social History Narrative   Lives alone/2025   Social Drivers of Health   Financial Resource Strain: Low Risk  (10/28/2023)   Overall Financial Resource Strain (CARDIA)    Difficulty of Paying Living Expenses: Not very hard  Food Insecurity: No Food Insecurity (10/28/2023)   Hunger Vital Sign    Worried About Running Out of Food in the Last Year: Never true    Ran Out of Food in the Last Year: Never true  Transportation Needs: No Transportation Needs (10/28/2023)   PRAPARE - Administrator, Civil Service (Medical): No    Lack of Transportation (Non-Medical): No  Physical Activity: Insufficiently Active (10/28/2023)   Exercise Vital Sign    Days of Exercise per Week: 3 days    Minutes of Exercise per Session: 30 min  Stress: No Stress Concern Present (10/28/2023)   [employer] of Occupational Health - Occupational Stress Questionnaire    Feeling of Stress : Not at all  Social Connections: Socially Isolated (10/28/2023)   Social Connection and Isolation Panel [NHANES]    Frequency of Communication with Friends and Family: More than three times a week    Frequency of Social Gatherings with Friends and Family: More than three times a week    Attends Religious Services: Never    Database administrator or Organizations: No    Attends Engineer, structural: Never    Marital Status: Divorced    Tobacco Counseling Counseling given: Not Answered    Clinical Intake:  Pre-visit preparation completed: Yes  Pain : No/denies pain     BMI - recorded: 30.18 Nutritional Status: BMI > 30  Obese Nutritional Risks: None Diabetes: No  Lab Results  Component Value Date   HGBA1C 6.0 04/27/2022   HGBA1C 5.8 12/02/2015   HGBA1C 5.9 10/02/2014     How often  do you need to have someone help you when you read instructions, pamphlets, or other written materials from your doctor or pharmacy?: 1 - Never  Interpreter Needed?: No  Information entered by :: Akeen Ledyard, RMA   Activities of Daily Living     10/28/2023    1:10 PM  In your present state of health, do you have any difficulty performing the following activities:  Hearing? 0  Vision? 0  Difficulty concentrating or making decisions? 0  Walking or climbing stairs? 0  Dressing or bathing? 0  Doing errands, shopping? 0  Preparing Food and eating ? N  Using the Toilet? N  In the past six months, have you accidently leaked urine? N  Do you have problems with loss of bowel control? N  Managing your Medications? N  Managing your Finances? N  Housekeeping or managing your Housekeeping?  N    Patient Care Team: Plotnikov, Oakley Bellman, MD as PCP - General (Internal Medicine) [facility], [place] as Consulting Physician (Optometry)  Indicate any recent Medical Services you may have received from other than [facility] providers in the past year (date may be approximate).     Assessment:   This is a routine wellness examination for [place].  Hearing/Vision screen Hearing Screening - Comments:: Denies hearing difficulties   Vision Screening - Comments:: Denies vision issues.   Goals Addressed   None    Depression Screen     10/28/2023    1:15 PM 05/24/2023    2:11 PM 08/31/2022    2:44 PM 04/27/2022   10:54 AM 08/27/2021   10:53 AM 02/17/2021    8:59 AM 08/14/2020    3:18 PM  PHQ 2/9 Scores  PHQ - 2 Score 0 0 0 0 0 0 0  PHQ- 9 Score 0   1  2     Fall Risk     10/28/2023    1:12 PM 05/24/2023    2:11 PM 08/31/2022    2:43 PM 04/27/2022   10:54 AM 08/27/2021   10:48 AM  Fall Risk   Falls in the past year? 0 0 0 0 0  Number falls in past yr: 0 0 0 0 0  Injury with Fall? 0 0 0 0 0  Risk for fall due to : No Fall Risks No Fall Risks No Fall Risks No Fall Risks No Fall Risks   Follow up Falls prevention discussed;Falls evaluation completed Falls evaluation completed Falls prevention discussed  Falls evaluation completed    MEDICARE RISK AT HOME:  Medicare Risk at Home Any stairs in or around the home?: No Home free of loose throw rugs in walkways, pet beds, electrical cords, etc?: Yes Adequate lighting in your home to reduce risk of falls?: Yes Life alert?: No Use of a cane, walker or w/c?: No Grab bars in the bathroom?: Yes Shower chair or bench in shower?: Yes Elevated toilet seat or a handicapped toilet?: Yes  TIMED UP AND GO:  Was the test performed?  No  Cognitive Function: Declined/Normal: No cognitive concerns noted by patient or family. Patient alert, oriented, able to answer questions appropriately and recall recent events. No signs of memory loss or confusion.        08/31/2022    2:43 PM  6CIT Screen  What Year? 0 points  What month? 0 points  What time? 0 points  Count back from 20 0 points  Months in reverse 0 points  Repeat phrase 0 points  Total Score 0 points    Immunizations Immunization History  Administered Date(s) Administered   PFIZER(Purple Top)SARS-COV-2 Vaccination 09/16/2019, 10/07/2019   Tdap 03/11/2018    Screening Tests Health Maintenance  Topic Date Due   Colonoscopy  Never done   Pneumonia Vaccine 37+ Years old (1 of 1 - PCV) Never done   Zoster Vaccines- Shingrix  (1 of 2) Never done   MAMMOGRAM  02/07/2023   COVID-19 Vaccine (3 - 2024-25 season) 02/28/2023   Medicare Annual Wellness (AWV)  08/31/2023   INFLUENZA VACCINE  01/28/2024   DTaP/Tdap/Td (2 - Td or Tdap) 03/11/2028   DEXA SCAN  Completed   Hepatitis C Screening  Completed   HPV VACCINES  Aged Out   Meningococcal B Vaccine  Aged Out    Health Maintenance  Health Maintenance Due  Topic Date Due   Colonoscopy  Never done  Pneumonia Vaccine 81+ Years old (1 of 1 - PCV) Never done   Zoster Vaccines- Shingrix  (1 of 2) Never done   MAMMOGRAM   02/07/2023   COVID-19 Vaccine (3 - 2024-25 season) 02/28/2023   Medicare Annual Wellness (AWV)  08/31/2023   Health Maintenance Items Addressed: Mammogram ordered, See Nurse Notes  Additional Screening:  Vision Screening: Recommended annual ophthalmology exams for early detection of glaucoma and other disorders of the eye.  Dental Screening: Recommended annual dental exams for proper oral hygiene  Community Resource Referral / Chronic Care Management: CRR required this visit?  No   CCM required this visit?  No     Plan:     I have personally reviewed and noted the following in the patient's chart:   Medical and social history Use of alcohol, tobacco or illicit drugs  Current medications and supplements including opioid prescriptions. Patient is not currently taking opioid prescriptions. Functional ability and status Nutritional status Physical activity Advanced directives List of other physicians Hospitalizations, surgeries, and ER visits in previous 12 months Vitals Screenings to include cognitive, depression, and falls Referrals and appointments  In addition, I have reviewed and discussed with patient certain preventive protocols, quality metrics, and best practice recommendations. A written personalized care plan for preventive services as well as general preventive health recommendations were provided to patient.     Leara Rawl L Yides Saidi, CMA   10/28/2023   After Visit Summary: (MyChart) Due to this being a telephonic visit, the after visit summary with patients personalized plan was offered to patient via MyChart   Notes: Please refer to Routing Comments.  Medical screening examination/treatment/procedure(s) were performed by non-physician practitioner and as supervising physician I was immediately available for consultation/collaboration.  I agree with above. Adelaide Holy, MD

## 2023-10-28 NOTE — Patient Instructions (Addendum)
 Theresa Terry , Thank you for taking time to come for your Medicare Wellness Visit. I appreciate your ongoing commitment to your health goals. Please review the following plan we discussed and let me know if I can assist you in the future.   Referrals/Orders/Follow-Ups/Clinician Recommendations: It was nice talking with you today.  You are due for a mammogram and a Cologuard screening.  Please discuss during your up coming visit with PCP about the Cologuard kit.  Aim for 30 minutes of exercise or brisk walking, 6-8 glasses of water, and 5 servings of fruits and vegetables each day. I will send out Advance directive forms to you today.  You have an order for:  [x]   3D Mammogram      Please call for appointment:  [facility] [address] [place], [place] [address] [phone]   Make sure to wear two-piece clothing.  No lotions, powders, or deodorants the day of the appointment. Make sure to bring picture ID and insurance card.  Bring list of medications you are currently taking including any supplements.    This is a list of the screening recommended for you and due dates:  Health Maintenance  Topic Date Due   Colon Cancer Screening  Never done   Pneumonia Vaccine (1 of 1 - PCV) Never done   Zoster (Shingles) Vaccine (1 of 2) Never done   Mammogram  02/07/2023   COVID-19 Vaccine (3 - 2024-25 season) 02/28/2023   Medicare Annual Wellness Visit  08/31/2023   Flu Shot  01/28/2024   DTaP/Tdap/Td vaccine (2 - Td or Tdap) 03/11/2028   DEXA scan (bone density measurement)  Completed   Hepatitis C Screening  Completed   HPV Vaccine  Aged Out   Meningitis B Vaccine  Aged Out    Advanced directives: (Provided) Advance directive discussed with you today. I have provided a copy for you to complete at home and have notarized. Once this is complete, please bring a copy in to our office so we can scan it into your chart.   Next Medicare Annual Wellness Visit scheduled  for next year: Yes

## 2023-11-05 ENCOUNTER — Encounter (HOSPITAL_COMMUNITY): Payer: Self-pay

## 2023-11-12 ENCOUNTER — Ambulatory Visit
Admission: RE | Admit: 2023-11-12 | Discharge: 2023-11-12 | Disposition: A | Discharge status: Home / Self Care | Source: Ambulatory Visit | Attending: Internal Medicine | Admitting: Internal Medicine

## 2023-11-12 DIAGNOSIS — Z1231 Encounter for screening mammogram for malignant neoplasm of breast: Secondary | ICD-10-CM | POA: Diagnosis not present

## 2024-02-24 ENCOUNTER — Ambulatory Visit (INDEPENDENT_AMBULATORY_CARE_PROVIDER_SITE_OTHER)

## 2024-02-24 ENCOUNTER — Encounter: Payer: Self-pay | Admitting: Internal Medicine

## 2024-02-24 ENCOUNTER — Ambulatory Visit (INDEPENDENT_AMBULATORY_CARE_PROVIDER_SITE_OTHER): Admitting: Internal Medicine

## 2024-02-24 ENCOUNTER — Ambulatory Visit: Payer: Self-pay | Admitting: Internal Medicine

## 2024-02-24 VITALS — BP 132/82 | HR 64 | Temp 97.9°F | Ht 62.0 in | Wt 167.0 lb

## 2024-02-24 DIAGNOSIS — M1611 Unilateral primary osteoarthritis, right hip: Secondary | ICD-10-CM | POA: Diagnosis not present

## 2024-02-24 DIAGNOSIS — I10 Essential (primary) hypertension: Secondary | ICD-10-CM | POA: Diagnosis not present

## 2024-02-24 DIAGNOSIS — M25551 Pain in right hip: Secondary | ICD-10-CM | POA: Diagnosis not present

## 2024-02-24 DIAGNOSIS — M47816 Spondylosis without myelopathy or radiculopathy, lumbar region: Secondary | ICD-10-CM | POA: Diagnosis not present

## 2024-02-24 MED ORDER — CELECOXIB 200 MG PO CAPS: 200.0000 mg | ORAL_CAPSULE | Freq: Two times a day (BID) | ORAL | 2 refills | Status: DC | PRN

## 2024-02-24 NOTE — Patient Instructions (Addendum)
 Hip opener exercises  USEFUL THINGS FOR ARTHRITIS and musculoskeletal pains:    A "rice sock heating pad" refers to a homemade heating pad created by filling a sock with uncooked rice, which can be heated in a microwave to provide a warm compress for sore muscles, pain relief, or other applications; essentially, it's a simple way to generate heat using readily available materials.  Key points about rice sock heat: How to make it: Fill a clean sock (preferably a tube sock) about 2/3 full with uncooked rice, tie a knot at the top to secure the rice inside.  Heating it up: Place the rice sock in the microwave and heat in short intervals (usually around 30 seconds at a time) until it reaches the desired warmth.  Important considerations: Check temperature before applying: Always test the temperature of the rice sock before applying it to your skin to avoid burns.  Use a towel to protect skin: Wrap the rice sock in a thin towel to distribute the heat evenly and protect your skin.  Uses: Muscle aches and pains  Menstrual cramps  Neck pain  Arthritis discomfort    BLUE EMU CREAM: Use it 2-3 times a day on painful areas

## 2024-02-24 NOTE — Progress Notes (Signed)
 Subjective:  Patient ID: Theresa Terry, female    DOB: 03-18-1951  Age: 73 y.o. MRN: [id number]  CC: Hip Pain (Pt states she has been having rt hip pain x4 weeks that radiates into her glute and down her rt leg... Pt states she has also some issues with her muscles in her legs that is cramping... also has an insect bite on outter lower left leg she has a concern about )   HPI Theresa Terry presents for moderate to severe R hip pain x 4 weeks.  The pain is 6 out of 10 at times.  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  The pain is located in the outer right hip and it radiates to the back of the leg.  The patient has been taking 2 aspirin aspirin tablets twice a day as needed.  The pain started after she helped her daughter to move.  She was carrying heavy stuff. Limping at times.  No pain at night in bed.  There is no low back pain.  Blood pressure has been normal  Outpatient Medications Prior to Visit  Medication Sig Dispense Refill   albuterol  (PROVENTIL  HFA;VENTOLIN  HFA) 108 (90 Base) MCG/ACT inhaler Inhale 1-2 puffs into the lungs every 6 (six) hours as needed for wheezing. 1 Inhaler 3   albuterol  (PROVENTIL ) (2.5 MG/3ML) 0.083% nebulizer solution Take 3 mLs (2.5 mg total) by nebulization 3 (three) times daily as needed for wheezing or shortness of breath. 50 vial 6   Cholecalciferol (VITAMIN D3) 2000 units capsule Take 1 capsule (2,000 Units total) by mouth daily. 100 capsule 3   finasteride  (PROSCAR ) 5 MG tablet Take 1/4 tab daily for hair 30 tablet 4   fluticasone  (FLONASE ) 50 MCG/ACT nasal spray Place 2 sprays into both nostrils daily. 48 g 3   loratadine (CLARITIN) 10 MG tablet Take 10 mg by mouth daily.     Multiple Vitamin (MULTIVITAMIN) tablet Take 1 tablet by mouth daily.     pantoprazole  (PROTONIX ) 40 MG tablet Take 1 tablet (40 mg total) by mouth daily. 90 tablet 3   triamterene -hydrochlorothiazide (MAXZIDE-25) 37.5-25 MG tablet Take 1 tablet by mouth  daily. Annual appt is due in must see provider for future refills 90 tablet 3   No facility-administered medications prior to visit.    ROS: Review of Systems  Constitutional:  Negative for activity change, appetite change, chills, fatigue and unexpected weight change.  HENT:  Negative for congestion, mouth sores and sinus pressure.   Eyes:  Negative for visual disturbance.  Respiratory:  Negative for cough and chest tightness.   Gastrointestinal:  Negative for abdominal pain and nausea.  Genitourinary:  Negative for difficulty urinating, frequency and vaginal pain.  Musculoskeletal:  Positive for arthralgias. Negative for back pain and gait problem.  Skin:  Negative for pallor and rash.  Neurological:  Negative for dizziness, tremors, weakness, numbness and headaches.  Psychiatric/Behavioral:  Negative for confusion and sleep disturbance.     Objective:  BP 132/82   Pulse 64   Temp 97.9 F (36.6 C) (Oral)   Ht 5' 2 (1.575 m)   Wt 167 lb (75.8 kg)   SpO2 98%   BMI 30.54 kg/m   BP Readings from Last 3 Encounters:  02/24/24 132/82  05/24/23 130/80  04/27/22 120/88    Wt Readings from Last 3 Encounters:  02/24/24 167 lb (75.8 kg)  10/28/23 165 lb (74.8 kg)  05/24/23 165 lb (74.8 kg)    Physical Exam  Constitutional:      General: She is not in acute distress.    Appearance: She is well-developed.  HENT:     Head: Normocephalic.     Right Ear: External ear normal.     Left Ear: External ear normal.     Nose: Nose normal.  Eyes:     General:        Right eye: No discharge.        Left eye: No discharge.     Conjunctiva/sclera: Conjunctivae normal.     Pupils: Pupils are equal, round, and reactive to light.  Neck:     Thyroid : No thyromegaly.     Vascular: No JVD.     Trachea: No tracheal deviation.  Cardiovascular:     Rate and Rhythm: Normal rate and regular rhythm.     Heart sounds: Normal heart sounds.  Pulmonary:     Effort: No respiratory distress.      Breath sounds: No stridor. No wheezing.  Abdominal:     General: Bowel sounds are normal. There is no distension.     Palpations: Abdomen is soft. There is no mass.     Tenderness: There is no abdominal tenderness. There is no guarding or rebound.  Musculoskeletal:        General: Tenderness present.     Cervical back: Normal range of motion and neck supple. No rigidity.     Right lower leg: No edema.     Left lower leg: No edema.  Lymphadenopathy:     Cervical: No cervical adenopathy.  Skin:    Findings: No erythema or rash.  Neurological:     Cranial Nerves: No cranial nerve deficit.     Motor: No abnormal muscle tone.     Coordination: Coordination normal.     Deep Tendon Reflexes: Reflexes normal.  Psychiatric:        Behavior: Behavior normal.        Thought Content: Thought content normal.        Judgment: Judgment normal.   Limping slightly.  Straight leg elevation is negative bilaterally, lumbar spine is nontender.  Right lateral hip is very tender to palpation Right groin palpation is slightly sensitive.  Hips with full range of motion  Lab Results  Component Value Date   WBC 7.6 05/24/2023   HGB 15.2 (H) 05/24/2023   HCT 46.0 05/24/2023   PLT 291.0 05/24/2023   GLUCOSE 93 05/24/2023   CHOL 205 (H) 05/24/2023   TRIG 106.0 05/24/2023   HDL 75.80 05/24/2023   LDLCALC 108 (H) 05/24/2023   ALT 16 05/24/2023   AST 20 05/24/2023   NA 140 05/24/2023   K 4.1 05/24/2023   CL 104 05/24/2023   CREATININE 0.88 05/24/2023   BUN 14 05/24/2023   CO2 27 05/24/2023   TSH 0.79 05/24/2023   HGBA1C 6.0 04/27/2022    MM 3D SCREENING MAMMOGRAM BILATERAL BREAST Result Date: 11/17/2023 CLINICAL DATA:  Screening. EXAM: DIGITAL SCREENING BILATERAL MAMMOGRAM WITH TOMOSYNTHESIS AND CAD TECHNIQUE: Bilateral screening digital craniocaudal and mediolateral oblique mammograms were obtained. Bilateral screening digital breast tomosynthesis was performed. The images were evaluated with  computer-aided detection. COMPARISON:  Previous exam(s). ACR Breast Density Category a: The breasts are almost entirely fatty. FINDINGS: There are no findings suspicious for malignancy. IMPRESSION: No mammographic evidence of malignancy. A result letter of this screening mammogram will be mailed directly to the patient. RECOMMENDATION: Screening mammogram in one year. (Code:SM-B-01Y) BI-RADS CATEGORY  1: Negative. Electronically Signed  By: Alm Parkins M.D.   On: 11/17/2023 12:38    Assessment & Plan:   Problem List Items Addressed This Visit     Essential hypertension   Cont w/Maxzide       Pain of right hip - Primary   New moderate to severe R hip pain x 4 weeks.  The pain is 6 out of 10 at times.  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  The pain is located in the outer right hip and it radiates to the back of the leg.  The patient has been taking 2 aspirin aspirin tablets twice a day as needed.  The pain started after she helped her daughter to move.  She was carrying heavy stuff. Limping at times.  No pain at night in bed.  There is no low back pain.  Likely trochanteric bursitis. Obtain the right hip x-ray Start Celebrex  200 mg twice a day for 2 weeks then twice a day as needed.  Ice/heat/massage She pulmonary exercises      Relevant Orders   DG HIP UNILAT WITH PELVIS 2-3 VIEWS RIGHT (Completed)      Meds ordered this encounter  Medications   celecoxib  (CELEBREX ) 200 MG capsule    Sig: Take 1 capsule (200 mg total) by mouth 2 (two) times daily as needed.    Dispense:  60 capsule    Refill:  2      Follow-up: Return in about 6 weeks (around 04/06/2024).  Marolyn Noel, MD

## 2024-02-24 NOTE — Assessment & Plan Note (Signed)
 New moderate to severe R hip pain x 4 weeks.  The pain is 6 out of 10 at times.  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  The pain is located in the outer right hip and it radiates to the back of the leg.  The patient has been taking 2 aspirin aspirin tablets twice a day as needed.  The pain started after she helped her daughter to move.  She was carrying heavy stuff. Limping at times.  No pain at night in bed.  There is no low back pain.  Likely trochanteric bursitis. Obtain the right hip x-ray Start Celebrex  200 mg twice a day for 2 weeks then twice a day as needed.  Ice/heat/massage She pulmonary exercises

## 2024-02-24 NOTE — Assessment & Plan Note (Signed)
 Cont w/Maxzide ?

## 2024-05-10 DIAGNOSIS — E669 Obesity, unspecified: Secondary | ICD-10-CM | POA: Diagnosis not present

## 2024-05-10 DIAGNOSIS — Z6832 Body mass index (BMI) 32.0-32.9, adult: Secondary | ICD-10-CM | POA: Diagnosis not present

## 2024-05-10 DIAGNOSIS — M1990 Unspecified osteoarthritis, unspecified site: Secondary | ICD-10-CM | POA: Diagnosis not present

## 2024-05-10 DIAGNOSIS — Z8249 Family history of ischemic heart disease and other diseases of the circulatory system: Secondary | ICD-10-CM | POA: Diagnosis not present

## 2024-05-10 DIAGNOSIS — I10 Essential (primary) hypertension: Secondary | ICD-10-CM | POA: Diagnosis not present

## 2024-05-10 DIAGNOSIS — Z791 Long term (current) use of non-steroidal anti-inflammatories (NSAID): Secondary | ICD-10-CM | POA: Diagnosis not present

## 2024-05-22 ENCOUNTER — Other Ambulatory Visit: Payer: Self-pay | Admitting: Internal Medicine

## 2024-05-30 ENCOUNTER — Other Ambulatory Visit: Payer: Self-pay | Admitting: Internal Medicine

## 2024-06-23 ENCOUNTER — Telehealth: Payer: Self-pay

## 2024-06-23 ENCOUNTER — Ambulatory Visit: Payer: Self-pay

## 2024-06-23 DIAGNOSIS — Z8249 Family history of ischemic heart disease and other diseases of the circulatory system: Secondary | ICD-10-CM

## 2024-06-23 NOTE — Telephone Encounter (Signed)
 FYI Only or Action Required?: FYI only for provider: appointment scheduled on 12/29.  Patient was last seen in primary care on 02/24/2024 by Plotnikov, Karlynn GAILS, MD.  Called Nurse Triage reporting Leg Swelling.  Symptoms began several days ago.  Interventions attempted: OTC medications: Tylenol arthritis and ibuprofen and Ice/heat application.  Symptoms are: gradually worsening.  Triage Disposition: See PCP When Office is Open (Within 3 Days)  Patient/caregiver understands and will follow disposition?: Yes    Onset moderate swelling in the left knee and up to 5/10 pain in left knee joint with certain movements Wednesday. No redness. Swelling went down some over night. Bruise on left leg above ankle size of a quarter. No obvious injuries, denies any recent significant injury or strain. No CP or SOB. No fever. Able to walk around. Denies hx of CHF or blood clots. 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 Reports her mother had a blood clotting disorder, pt concerned she may have it. Scheduled appt with different provider at home office on Monday d/t no PCP availability within timeframe. Advised UC or ED for worsening symptoms.    Copied from CRM [id number]. Topic: Clinical - Red Word Triage >> Jun 23, 2024  4:16 PM Rea ORN wrote: Red Word that prompted transfer to Nurse Triage: left knee and leg swelling, painful. Pt concerned about blood clots Reason for Disposition  MILD or MODERATE swelling (e.g., can't move joint normally, can't do usual activities) (Exceptions: Itchy, localized swelling; swelling is chronic.)  Answer Assessment - Initial Assessment Questions 1. LOCATION: Where is the swelling located?  (e.g., left, right, both knees)     Left knee  2. ONSET: When did the swelling start? Does it come and go, or is it there all the time?     Wednesday  3. SWELLING: How bad is the swelling? Or, How large is it? (e.g., mild, moderate, severe; size of localized swelling)       Moderate  4. PAIN: Is there any pain? If Yes, ask: How bad is it? (Scale 0-10; or none, mild, moderate, severe)     5/10 with certain movements  5. SETTING: Has there been any recent work, exercise or other activity that involved that part of the body?      Was helping her daughter lift some heavy bags recently  6. AGGRAVATING FACTORS: What makes the knee swelling worse? (e.g., walking, climbing stairs, running)     Walking  7. ASSOCIATED SYMPTOMS: Is there any pain or redness?     Denies  8. OTHER SYMPTOMS: Do you have any other symptoms? (e.g., calf pain, chest pain, difficulty breathing, fever)     Denies  Protocols used: Knee Swelling-A-AH

## 2024-06-23 NOTE — Telephone Encounter (Signed)
 Copied from CRM #[id number]. Topic: Clinical - Request for Lab/Test Order >> Jun 23, 2024 12:53 PM Ashley R wrote: Reason for CRM: Requesting bloodwork, checking for clotting condition, hereditary. Offered appt in early Feb with PCP   ----------------------------------------------------------------------- From previous Reason for Contact - Lab/Test Results: Reason for CRM:    ----------------------------------------------------------------------- From previous Reason for Contact - Scheduling: Patient/patient representative is calling to schedule an appointment. Refer to attachments for appointment information.

## 2024-06-26 ENCOUNTER — Ambulatory Visit: Admitting: Family Medicine

## 2024-06-27 NOTE — Telephone Encounter (Signed)
 I need to know more about it.  Who had a blood clot?  Was he ready to recondition was diagnosed in this family member?  Thanks

## 2024-06-30 NOTE — Telephone Encounter (Signed)
 I was able to speak with the pt and she hs stated her mom had a condition with her blood that she had to be put on blood thinners for due to this condition being hereditary.  Pt states she will speak with her daughter and have her call the office with the condition exact name so provider can order proper testing.

## 2024-07-03 NOTE — Telephone Encounter (Unsigned)
 Copied from CRM [id number]. Topic: Clinical - Medical Advice >> Jun 30, 2024  5:00 PM Shereese L wrote: Reason for CRM: Patient called in to return office call to advise the name of the condition was Factor V Leiden thrombophilia APD so that she can get tested for it

## 2024-07-04 NOTE — Addendum Note (Signed)
 Addended by: Icelynn Onken V on: 07/04/2024 07:27 AM   Modules accepted: Orders

## 2024-07-04 NOTE — Telephone Encounter (Signed)
 Noted.  I ordered blood tests.  Thanks

## 2024-07-06 ENCOUNTER — Ambulatory Visit: Admitting: Family Medicine

## 2024-07-06 DIAGNOSIS — Z8249 Family history of ischemic heart disease and other diseases of the circulatory system: Secondary | ICD-10-CM

## 2024-07-06 NOTE — Progress Notes (Signed)
 "  Subjective:     Patient ID: Theresa Terry, female    DOB: 03-12-1951, 74 y.o.   MRN: [id number]  Chief Complaint  Patient presents with   Acute Visit    HPI  Discussed the use of AI scribe software for clinical note transcription with the patient, who gave verbal consent to proceed.  History of Present Illness   Did not see patient.  She reported to the CMA that she is here for labs ordered by her PCP and declines to be seen for knee pain.  Reports knee pain has resolved.   Health Maintenance Due  Topic Date Due   Colonoscopy  Never done   Pneumococcal Vaccine: 50+ Years (1 of 1 - PCV) Never done   Zoster Vaccines- Shingrix  (1 of 2) Never done   Influenza Vaccine  Never done   COVID-19 Vaccine (3 - 2025-26 season) 02/28/2024    Past Medical History:  Diagnosis Date   Allergy    Arthritis    Asthma    Cataract    GERD (gastroesophageal reflux disease)    Hypertension     Past Surgical History:  Procedure Laterality Date   ABDOMINAL HYSTERECTOMY     complete   TONSILLECTOMY AND ADENOIDECTOMY  1961    Family History  Problem Relation Age of Onset   Hypertension Mother    Heart failure Mother    Alcohol abuse Mother    Heart disease Mother    Alcohol abuse Father    Heart disease Father    Alcohol abuse Brother    Heart disease Maternal Aunt    Heart disease Maternal Uncle     Social History   Socioeconomic History   Marital status: Divorced    Spouse name: Not on file   Number of children: 4   Years of education: Not on file   Highest education level: Some college, no degree  Occupational History   Occupation: RETIRED  Tobacco Use   Smoking status: Never    Passive exposure: Yes   Smokeless tobacco: Never  Vaping Use   Vaping status: Never Used  Substance and Sexual Activity   Alcohol use: Yes    Alcohol/week: 0.0 standard drinks of alcohol   Drug use: No   Sexual activity: Not Currently  Other Topics Concern   Not on file  Social  History Narrative   Lives alone/2025   Social Drivers of Health   Tobacco Use: Medium Risk (02/24/2024)   Patient History    Smoking Tobacco Use: Never    Smokeless Tobacco Use: Never    Passive Exposure: Yes  Financial Resource Strain: Low Risk (10/28/2023)   Overall Financial Resource Strain (CARDIA)    Difficulty of Paying Living Expenses: Not very hard  Food Insecurity: No Food Insecurity (10/28/2023)   Hunger Vital Sign    Worried About Running Out of Food in the Last Year: Never true    Ran Out of Food in the Last Year: Never true  Transportation Needs: No Transportation Needs (10/28/2023)   PRAPARE - Administrator, Civil Service (Medical): No    Lack of Transportation (Non-Medical): No  Physical Activity: Insufficiently Active (10/28/2023)   Exercise Vital Sign    Days of Exercise per Week: 3 days    Minutes of Exercise per Session: 30 min  Stress: No Stress Concern Present (10/28/2023)   [employer] of Occupational Health - Occupational Stress Questionnaire    Feeling of Stress : Not at all  Social Connections: Socially Isolated (10/28/2023)   Social Connection and Isolation Panel    Frequency of Communication with Friends and Family: More than three times a week    Frequency of Social Gatherings with Friends and Family: More than three times a week    Attends Religious Services: Never    Database Administrator or Organizations: No    Attends Banker Meetings: Never    Marital Status: Divorced  Catering Manager Violence: Patient Unable To Answer (10/28/2023)   Humiliation, Afraid, Rape, and Kick questionnaire    Fear of Current or Ex-Partner: Patient unable to answer    Emotionally Abused: Patient unable to answer    Physically Abused: Patient unable to answer    Sexually Abused: Patient unable to answer  Depression (PHQ2-9): Low Risk (10/28/2023)   Depression (PHQ2-9)    PHQ-2 Score: 0  Alcohol Screen: Low Risk (10/28/2023)   Alcohol Screen    Last  Alcohol Screening Score (AUDIT): 0  Housing: Unknown (10/28/2023)   Housing Stability Vital Sign    Unable to Pay for Housing in the Last Year: No    Number of Times Moved in the Last Year: Not on file    Homeless in the Last Year: No  Utilities: Not At Risk (10/28/2023)   AHC Utilities    Threatened with loss of utilities: No  Health Literacy: Adequate Health Literacy (10/28/2023)   B1300 Health Literacy    Frequency of need for help with medical instructions: Never    Outpatient Medications Prior to Visit  Medication Sig Dispense Refill   albuterol  (PROVENTIL  HFA;VENTOLIN  HFA) 108 (90 Base) MCG/ACT inhaler Inhale 1-2 puffs into the lungs every 6 (six) hours as needed for wheezing. 1 Inhaler 3   albuterol  (PROVENTIL ) (2.5 MG/3ML) 0.083% nebulizer solution Take 3 mLs (2.5 mg total) by nebulization 3 (three) times daily as needed for wheezing or shortness of breath. 50 vial 6   celecoxib  (CELEBREX ) 200 MG capsule TAKE 1 CAPSULE BY MOUTH TWICE DAILY AS NEEDED 60 capsule 1   Cholecalciferol (VITAMIN D3) 2000 units capsule Take 1 capsule (2,000 Units total) by mouth daily. 100 capsule 3   finasteride  (PROSCAR ) 5 MG tablet Take 1/4 tab daily for hair 30 tablet 4   fluticasone  (FLONASE ) 50 MCG/ACT nasal spray Place 2 sprays into both nostrils daily. 48 g 3   loratadine (CLARITIN) 10 MG tablet Take 10 mg by mouth daily.     Multiple Vitamin (MULTIVITAMIN) tablet Take 1 tablet by mouth daily.     pantoprazole  (PROTONIX ) 40 MG tablet Take 1 tablet by mouth once daily 90 tablet 3   triamterene -hydrochlorothiazide (MAXZIDE-25) 37.5-25 MG tablet Take 1 tablet by mouth once daily 90 tablet 3   No facility-administered medications prior to visit.    Allergies[1]  ROS     Objective:    Physical Exam   BP 122/78 (BP Location: Left Arm, Patient Position: Sitting, Cuff Size: Normal)   Pulse 70   Temp 98.4 F (36.9 C) (Oral)   Ht 5' 2 (1.575 m)   Wt 169 lb (76.7 kg)   SpO2 98%   BMI 30.91  kg/m  Wt Readings from Last 3 Encounters:  07/06/24 169 lb (76.7 kg)  02/24/24 167 lb (75.8 kg)  10/28/23 165 lb (74.8 kg)       Assessment & Plan:   Problem List Items Addressed This Visit   None Visit Diagnoses       Family history of blood clots  Assessment and Plan Assessment & Plan      I am having Theresa Terry maintain her loratadine, albuterol , multivitamin, Vitamin D3, albuterol , fluticasone , finasteride , pantoprazole , triamterene -hydrochlorothiazide, and celecoxib .  No orders of the defined types were placed in this encounter.      [1] No Known Allergies  "

## 2024-07-18 LAB — HYPERCOAGULABLE PANEL, COMPREHENSIVE
APTT: 26 s
AT III Act/Nor PPP Chro: 113 %
Act. Prt C Resist w/FV Defic.: 2.9 ratio
Anticardiolipin Ab, IgG: <10 [GPL'U]
Anticardiolipin Ab, IgM: <10 [MPL'U]
Beta-2 Glycoprotein I, IgA: <10 SAU
Beta-2 Glycoprotein I, IgG: <10 SGU
Beta-2 Glycoprotein I, IgM: <10 SMU
DRVVT Screen Seconds: 40.4 s
Factor VII Antigen**: 132 %
Factor VIII Activity: 148 %
Hexagonal Phospholipid Neutral: 2 s
Homocysteine: 6.5 umol/L
Prot C Ag Act/Nor PPP Imm: >200 % — ABNORMAL HIGH
Prot S Ag Act/Nor PPP Imm: 114 %
Protein C Ag/FVII Ag Ratio**: >1.5 ratio
Protein S Ag/FVII Ag Ratio**: 0.9 ratio

## 2024-07-19 ENCOUNTER — Ambulatory Visit: Payer: Self-pay | Admitting: Internal Medicine

## 2024-07-26 ENCOUNTER — Ambulatory Visit: Payer: Self-pay | Admitting: *Deleted

## 2024-07-26 NOTE — Telephone Encounter (Signed)
" ° ° °  FYI Only or Action Required?: FYI only for provider: appointment scheduled on 07/28/24.  Patient was last seen in primary care on 07/06/2024 by Lendia Boby CROME, NP-C.  Called Nurse Triage reporting Nasal Congestion.  Symptoms began yesterday.  Interventions attempted: Rest, hydration, or home remedies.  Symptoms are: gradually worsening.  Triage Disposition: See Physician Within 24 Hours  Patient/caregiver understands and will follow disposition?: Yes                 Reason for Disposition  Earache  Answer Assessment - Initial Assessment Questions Offered earlier appt and patient requesting to see PCP. Appt scheduled for 07/28/24 with PCP. Recommended if sx worsen call back.     1. LOCATION: Where does it hurt?      Headache , runny nose right ear  2. ONSET: When did the sinus pain start?  (e.g., hours, days)      Yesterday  3. SEVERITY: How bad is the pain?   (Scale 0-10; or none, mild, moderate or severe)     Worsening today did not report as severe 5. NASAL CONGESTION: Is the nose blocked? If Yes, ask: Can you open it or must you breathe through your mouth?     No  6. NASAL DISCHARGE: Do you have discharge from your nose? If so ask, What color?     Yes runny nose did not report color 7. FEVER: Do you have a fever? If Yes, ask: What is it, how was it measured, and when did it start?      No   8. OTHER SYMPTOMS: Do you have any other symptoms? (e.g., sore throat, cough, earache, difficulty breathing)     Headache runny nose, right ear pain no fever no chest pain no difficulty breathing  Protocols used: Sinus Pain or Congestion-A-AH  "

## 2024-07-28 ENCOUNTER — Ambulatory Visit (INDEPENDENT_AMBULATORY_CARE_PROVIDER_SITE_OTHER)

## 2024-07-28 ENCOUNTER — Ambulatory Visit: Payer: Self-pay | Admitting: Internal Medicine

## 2024-07-28 ENCOUNTER — Ambulatory Visit: Admitting: Internal Medicine

## 2024-07-28 VITALS — BP 140/90 | HR 89 | Temp 98.4°F | Ht 62.0 in | Wt 169.0 lb

## 2024-07-28 DIAGNOSIS — J683 Other acute and subacute respiratory conditions due to chemicals, gases, fumes and vapors: Secondary | ICD-10-CM | POA: Diagnosis not present

## 2024-07-28 DIAGNOSIS — R058 Other specified cough: Secondary | ICD-10-CM

## 2024-07-28 DIAGNOSIS — I10 Essential (primary) hypertension: Secondary | ICD-10-CM

## 2024-07-28 DIAGNOSIS — K219 Gastro-esophageal reflux disease without esophagitis: Secondary | ICD-10-CM | POA: Diagnosis not present

## 2024-07-28 MED ORDER — METHYLPREDNISOLONE 4 MG PO TBPK: ORAL_TABLET | ORAL | 0 refills | Status: AC

## 2024-07-28 MED ORDER — HYDROCODONE BIT-HOMATROP MBR 5-1.5 MG/5ML PO SOLN: 5.0000 mL | Freq: Three times a day (TID) | ORAL | 0 refills | Status: AC | PRN

## 2024-07-28 MED ORDER — AZITHROMYCIN 250 MG PO TABS: ORAL_TABLET | ORAL | 0 refills | Status: AC

## 2024-07-28 MED ORDER — BUDESONIDE-FORMOTEROL FUMARATE 80-4.5 MCG/ACT IN AERO: 2.0000 | INHALATION_SPRAY | Freq: Two times a day (BID) | RESPIRATORY_TRACT | 3 refills | Status: AC

## 2024-07-28 NOTE — Assessment & Plan Note (Addendum)
 Medrol  pac Zpac Symbicort  bid Hycodan po prn Probiotic  H/o cotton mill work

## 2024-07-28 NOTE — Progress Notes (Unsigned)
 "  Subjective:  Patient ID: Theresa Terry, female    DOB: 11-10-1950  Age: 74 y.o. MRN: [id number]  CC: Acute Visit (Runny nose congestion ear pain started Monday /)   HPI Theresa Terry presents for URI -she has been sick since Monday Tuesday this week, the symptoms are getting worse.  Any is complaining of persistent cough, wheezing, nasal congestion with thick sputum and mucus  She used to work at a [employer] when she was young for about 10 years  Outpatient Medications Prior to Visit  Medication Sig Dispense Refill   albuterol  (PROVENTIL  HFA;VENTOLIN  HFA) 108 (90 Base) MCG/ACT inhaler Inhale 1-2 puffs into the lungs every 6 (six) hours as needed for wheezing. 1 Inhaler 3   albuterol  (PROVENTIL ) (2.5 MG/3ML) 0.083% nebulizer solution Take 3 mLs (2.5 mg total) by nebulization 3 (three) times daily as needed for wheezing or shortness of breath. 50 vial 6   celecoxib  (CELEBREX ) 200 MG capsule TAKE 1 CAPSULE BY MOUTH TWICE DAILY AS NEEDED 60 capsule 1   Cholecalciferol (VITAMIN D3) 2000 units capsule Take 1 capsule (2,000 Units total) by mouth daily. 100 capsule 3   finasteride  (PROSCAR ) 5 MG tablet Take 1/4 tab daily for hair 30 tablet 4   fluticasone  (FLONASE ) 50 MCG/ACT nasal spray Place 2 sprays into both nostrils daily. 48 g 3   loratadine (CLARITIN) 10 MG tablet Take 10 mg by mouth daily.     Multiple Vitamin (MULTIVITAMIN) tablet Take 1 tablet by mouth daily.     pantoprazole  (PROTONIX ) 40 MG tablet Take 1 tablet by mouth once daily 90 tablet 3   triamterene -hydrochlorothiazide (MAXZIDE-25) 37.5-25 MG tablet Take 1 tablet by mouth once daily 90 tablet 3   No facility-administered medications prior to visit.    ROS: Review of Systems  Constitutional:  Negative for activity change, appetite change, chills, fatigue and unexpected weight change.  HENT:  Positive for congestion and postnasal drip. Negative for mouth sores and sinus pressure.   Eyes:  Negative for visual  disturbance.  Respiratory:  Positive for cough and wheezing. Negative for chest tightness.   Gastrointestinal:  Negative for abdominal pain and nausea.  Genitourinary:  Negative for difficulty urinating, frequency and vaginal pain.  Musculoskeletal:  Positive for arthralgias. Negative for back pain and gait problem.  Skin:  Negative for pallor and rash.  Neurological:  Negative for dizziness, tremors, weakness, numbness and headaches.  Psychiatric/Behavioral:  Negative for confusion and sleep disturbance.     Objective:  BP (!) 140/90 Comment: pt states she did not take her blood pressure medication yet  Pulse 89   Temp 98.4 F (36.9 C) (Oral)   Ht 5' 2 (1.575 m)   Wt 169 lb (76.7 kg)   SpO2 98%   BMI 30.91 kg/m   BP Readings from Last 3 Encounters:  07/28/24 (!) 140/90  07/06/24 122/78  02/24/24 132/82    Wt Readings from Last 3 Encounters:  07/28/24 169 lb (76.7 kg)  07/06/24 169 lb (76.7 kg)  02/24/24 167 lb (75.8 kg)    Physical Exam Constitutional:      General: She is not in acute distress.    Appearance: Normal appearance. She is well-developed.  HENT:     Head: Normocephalic.     Right Ear: External ear normal.     Left Ear: External ear normal.     Nose: Congestion and rhinorrhea present.     Mouth/Throat:     Pharynx: Posterior oropharyngeal erythema present.  No oropharyngeal exudate.  Eyes:     General:        Right eye: No discharge.        Left eye: No discharge.     Conjunctiva/sclera: Conjunctivae normal.     Pupils: Pupils are equal, round, and reactive to light.  Neck:     Thyroid : No thyromegaly.     Vascular: No JVD.     Trachea: No tracheal deviation.  Cardiovascular:     Rate and Rhythm: Normal rate and regular rhythm.     Heart sounds: Normal heart sounds.  Pulmonary:     Effort: No respiratory distress.     Breath sounds: No stridor. No wheezing.  Abdominal:     General: Bowel sounds are normal. There is no distension.      Palpations: Abdomen is soft. There is no mass.     Tenderness: There is no abdominal tenderness. There is no guarding or rebound.  Musculoskeletal:        General: No tenderness.     Cervical back: Normal range of motion and neck supple. No rigidity.  Lymphadenopathy:     Cervical: No cervical adenopathy.  Skin:    Findings: No erythema or rash.  Neurological:     Cranial Nerves: No cranial nerve deficit.     Motor: No abnormal muscle tone.     Coordination: Coordination normal.     Deep Tendon Reflexes: Reflexes normal.  Psychiatric:        Behavior: Behavior normal.        Thought Content: Thought content normal.        Judgment: Judgment normal.   Erythematous throat.  No wheezing at present.  Not dyspneic.  Lab Results  Component Value Date   WBC 7.6 05/24/2023   HGB 15.2 (H) 05/24/2023   HCT 46.0 05/24/2023   PLT 291.0 05/24/2023   GLUCOSE 93 05/24/2023   CHOL 205 (H) 05/24/2023   TRIG 106.0 05/24/2023   HDL 75.80 05/24/2023   LDLCALC 108 (H) 05/24/2023   ALT 16 05/24/2023   AST 20 05/24/2023   NA 140 05/24/2023   K 4.1 05/24/2023   CL 104 05/24/2023   CREATININE 0.88 05/24/2023   BUN 14 05/24/2023   CO2 27 05/24/2023   TSH 0.79 05/24/2023   HGBA1C 6.0 04/27/2022    MM 3D SCREENING MAMMOGRAM BILATERAL BREAST Result Date: 11/17/2023 CLINICAL DATA:  Screening. EXAM: DIGITAL SCREENING BILATERAL MAMMOGRAM WITH TOMOSYNTHESIS AND CAD TECHNIQUE: Bilateral screening digital craniocaudal and mediolateral oblique mammograms were obtained. Bilateral screening digital breast tomosynthesis was performed. The images were evaluated with computer-aided detection. COMPARISON:  Previous exam(s). ACR Breast Density Category a: The breasts are almost entirely fatty. FINDINGS: There are no findings suspicious for malignancy. IMPRESSION: No mammographic evidence of malignancy. A result letter of this screening mammogram will be mailed directly to the patient. RECOMMENDATION: Screening  mammogram in one year. (Code:SM-B-01Y) BI-RADS CATEGORY  1: Negative. Electronically Signed   By: Alm Parkins M.D.   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  Obtain chest x-ray      Relevant Orders   DG Chest 2 View (Completed)   GERD (gastroesophageal reflux disease)   Currently on Protonix  1 daily.  Will continue         Meds ordered this encounter  Medications   budesonide -formoterol  (SYMBICORT ) 80-4.5 MCG/ACT inhaler    Sig: Inhale 2 puffs into the lungs 2 (two) times daily.    Dispense:  1 each    Refill:  3   HYDROcodone  bit-homatropine (HYCODAN) 5-1.5 MG/5ML syrup    Sig: Take 5 mLs by mouth every 8 (eight) hours as needed.    Dispense:  240 mL    Refill:  0   azithromycin  (ZITHROMAX  Z-PAK) 250 MG tablet    Sig: As directed    Dispense:  6 tablet    Refill:  0   methylPREDNISolone  (MEDROL  DOSEPAK) 4 MG TBPK tablet    Sig: As directed    Dispense:  21 tablet    Refill:  0      Follow-up: No follow-ups on file.  Marolyn Noel, MD "

## 2024-07-31 ENCOUNTER — Encounter: Payer: Self-pay | Admitting: Internal Medicine

## 2024-07-31 NOTE — Assessment & Plan Note (Signed)
 Currently on Protonix  1 daily.  Will continue

## 2024-07-31 NOTE — Assessment & Plan Note (Signed)
 Doing well on Maxide 1 a day

## 2024-10-30 ENCOUNTER — Ambulatory Visit
